# Patient Record
Sex: Female | Born: 1940 | Race: Black or African American | Hispanic: No
Health system: Southern US, Community
[De-identification: ages and names within clinical notes are randomized; demographics above are authoritative.]

## PROBLEM LIST (undated history)

## (undated) DIAGNOSIS — C9510 Chronic leukemia of unspecified cell type not having achieved remission: Secondary | ICD-10-CM

---

## 1999-12-12 ENCOUNTER — Encounter: Payer: Self-pay | Admitting: Internal Medicine

## 1999-12-12 ENCOUNTER — Ambulatory Visit (HOSPITAL_COMMUNITY): Admission: RE | Admit: 1999-12-12 | Discharge: 1999-12-12 | Payer: Self-pay | Admitting: Internal Medicine

## 2000-01-05 ENCOUNTER — Other Ambulatory Visit: Admission: RE | Admit: 2000-01-05 | Discharge: 2000-01-05 | Payer: Self-pay | Admitting: Oncology

## 2000-01-05 ENCOUNTER — Encounter: Admission: RE | Admit: 2000-01-05 | Discharge: 2000-01-05 | Payer: Self-pay | Admitting: Oncology

## 2000-01-05 ENCOUNTER — Encounter: Payer: Self-pay | Admitting: Oncology

## 2000-10-25 ENCOUNTER — Emergency Department (HOSPITAL_COMMUNITY): Admission: EM | Admit: 2000-10-25 | Discharge: 2000-10-26 | Payer: Self-pay

## 2000-10-30 ENCOUNTER — Encounter: Payer: Self-pay | Admitting: Internal Medicine

## 2000-10-30 ENCOUNTER — Encounter: Admission: RE | Admit: 2000-10-30 | Discharge: 2000-10-30 | Payer: Self-pay | Admitting: Internal Medicine

## 2000-11-30 ENCOUNTER — Encounter (HOSPITAL_BASED_OUTPATIENT_CLINIC_OR_DEPARTMENT_OTHER): Payer: Self-pay | Admitting: General Surgery

## 2000-12-03 ENCOUNTER — Ambulatory Visit (HOSPITAL_COMMUNITY): Admission: RE | Admit: 2000-12-03 | Discharge: 2000-12-03 | Payer: Self-pay | Admitting: General Surgery

## 2000-12-03 ENCOUNTER — Encounter (HOSPITAL_BASED_OUTPATIENT_CLINIC_OR_DEPARTMENT_OTHER): Payer: Self-pay | Admitting: General Surgery

## 2000-12-31 ENCOUNTER — Ambulatory Visit (HOSPITAL_COMMUNITY): Admission: RE | Admit: 2000-12-31 | Discharge: 2000-12-31 | Payer: Self-pay | Admitting: Oncology

## 2000-12-31 ENCOUNTER — Encounter: Payer: Self-pay | Admitting: Oncology

## 2001-01-22 ENCOUNTER — Ambulatory Visit: Admission: RE | Admit: 2001-01-22 | Discharge: 2001-01-22 | Payer: Self-pay | Admitting: Internal Medicine

## 2001-01-28 ENCOUNTER — Encounter: Payer: Self-pay | Admitting: Oncology

## 2001-01-28 ENCOUNTER — Ambulatory Visit (HOSPITAL_COMMUNITY): Admission: RE | Admit: 2001-01-28 | Discharge: 2001-01-28 | Payer: Self-pay | Admitting: Oncology

## 2001-05-08 ENCOUNTER — Ambulatory Visit (HOSPITAL_COMMUNITY): Admission: RE | Admit: 2001-05-08 | Discharge: 2001-05-08 | Payer: Self-pay | Admitting: General Surgery

## 2001-05-22 ENCOUNTER — Ambulatory Visit (HOSPITAL_COMMUNITY): Admission: RE | Admit: 2001-05-22 | Discharge: 2001-05-22 | Payer: Self-pay | Admitting: Internal Medicine

## 2002-03-21 ENCOUNTER — Ambulatory Visit (HOSPITAL_COMMUNITY): Admission: RE | Admit: 2002-03-21 | Discharge: 2002-03-21 | Payer: Self-pay | Admitting: Oncology

## 2002-03-21 ENCOUNTER — Encounter: Payer: Self-pay | Admitting: Oncology

## 2002-04-29 ENCOUNTER — Inpatient Hospital Stay (HOSPITAL_COMMUNITY): Admission: EM | Admit: 2002-04-29 | Discharge: 2002-05-02 | Payer: Self-pay | Admitting: Emergency Medicine

## 2002-04-29 ENCOUNTER — Encounter: Payer: Self-pay | Admitting: Emergency Medicine

## 2002-05-05 ENCOUNTER — Ambulatory Visit (HOSPITAL_COMMUNITY): Admission: RE | Admit: 2002-05-05 | Discharge: 2002-05-05 | Payer: Self-pay | Admitting: Oncology

## 2002-05-05 ENCOUNTER — Encounter: Payer: Self-pay | Admitting: Oncology

## 2003-03-03 ENCOUNTER — Ambulatory Visit: Admission: RE | Admit: 2003-03-03 | Discharge: 2003-03-03 | Payer: Self-pay | Admitting: Oncology

## 2003-05-01 ENCOUNTER — Ambulatory Visit (HOSPITAL_COMMUNITY): Admission: RE | Admit: 2003-05-01 | Discharge: 2003-05-01 | Payer: Self-pay | Admitting: Oncology

## 2003-12-12 ENCOUNTER — Ambulatory Visit: Payer: Self-pay | Admitting: Oncology

## 2003-12-15 ENCOUNTER — Inpatient Hospital Stay (HOSPITAL_COMMUNITY): Admission: EM | Admit: 2003-12-15 | Discharge: 2003-12-20 | Payer: Self-pay | Admitting: Oncology

## 2003-12-15 ENCOUNTER — Ambulatory Visit: Payer: Self-pay | Admitting: Oncology

## 2004-01-05 ENCOUNTER — Inpatient Hospital Stay (HOSPITAL_COMMUNITY): Admission: EM | Admit: 2004-01-05 | Discharge: 2004-01-11 | Payer: Self-pay | Admitting: Oncology

## 2004-01-05 ENCOUNTER — Ambulatory Visit: Payer: Self-pay | Admitting: Oncology

## 2004-02-12 ENCOUNTER — Ambulatory Visit: Payer: Self-pay | Admitting: Oncology

## 2004-03-21 ENCOUNTER — Ambulatory Visit (HOSPITAL_COMMUNITY): Admission: RE | Admit: 2004-03-21 | Discharge: 2004-03-21 | Payer: Self-pay | Admitting: Internal Medicine

## 2004-04-04 ENCOUNTER — Ambulatory Visit: Payer: Self-pay | Admitting: Oncology

## 2004-04-18 ENCOUNTER — Ambulatory Visit (HOSPITAL_COMMUNITY): Admission: RE | Admit: 2004-04-18 | Discharge: 2004-04-18 | Payer: Self-pay | Admitting: Oncology

## 2004-05-24 ENCOUNTER — Ambulatory Visit: Payer: Self-pay | Admitting: Oncology

## 2004-05-24 ENCOUNTER — Ambulatory Visit: Payer: Self-pay | Admitting: Pulmonary Disease

## 2004-05-27 ENCOUNTER — Ambulatory Visit: Payer: Self-pay | Admitting: Internal Medicine

## 2004-06-07 ENCOUNTER — Ambulatory Visit: Payer: Self-pay | Admitting: Pulmonary Disease

## 2004-06-29 ENCOUNTER — Ambulatory Visit: Payer: Self-pay | Admitting: Pulmonary Disease

## 2004-07-11 ENCOUNTER — Ambulatory Visit: Payer: Self-pay | Admitting: Oncology

## 2004-07-28 ENCOUNTER — Ambulatory Visit: Admission: RE | Admit: 2004-07-28 | Discharge: 2004-07-28 | Payer: Self-pay | Admitting: Oncology

## 2004-08-03 ENCOUNTER — Ambulatory Visit: Payer: Self-pay | Admitting: Pulmonary Disease

## 2004-09-05 ENCOUNTER — Ambulatory Visit: Payer: Self-pay | Admitting: Oncology

## 2004-10-21 ENCOUNTER — Ambulatory Visit: Payer: Self-pay | Admitting: Oncology

## 2004-11-21 ENCOUNTER — Inpatient Hospital Stay (HOSPITAL_COMMUNITY): Admission: EM | Admit: 2004-11-21 | Discharge: 2004-11-23 | Payer: Self-pay | Admitting: Oncology

## 2004-11-22 ENCOUNTER — Ambulatory Visit: Payer: Self-pay | Admitting: Oncology

## 2004-12-06 ENCOUNTER — Ambulatory Visit: Payer: Self-pay | Admitting: Oncology

## 2005-01-23 ENCOUNTER — Ambulatory Visit: Payer: Self-pay | Admitting: Oncology

## 2005-02-06 ENCOUNTER — Inpatient Hospital Stay (HOSPITAL_COMMUNITY): Admission: EM | Admit: 2005-02-06 | Discharge: 2005-02-09 | Payer: Self-pay | Admitting: Oncology

## 2005-02-09 ENCOUNTER — Ambulatory Visit: Payer: Self-pay | Admitting: Oncology

## 2005-03-16 ENCOUNTER — Ambulatory Visit: Payer: Self-pay | Admitting: Oncology

## 2005-04-13 ENCOUNTER — Ambulatory Visit (HOSPITAL_COMMUNITY): Admission: RE | Admit: 2005-04-13 | Discharge: 2005-04-13 | Payer: Self-pay | Admitting: Oncology

## 2005-05-03 ENCOUNTER — Ambulatory Visit: Payer: Self-pay | Admitting: Oncology

## 2005-06-07 ENCOUNTER — Encounter (INDEPENDENT_AMBULATORY_CARE_PROVIDER_SITE_OTHER): Payer: Self-pay | Admitting: *Deleted

## 2005-06-07 ENCOUNTER — Ambulatory Visit (HOSPITAL_COMMUNITY): Admission: RE | Admit: 2005-06-07 | Discharge: 2005-06-07 | Payer: Self-pay | Admitting: Internal Medicine

## 2005-06-14 LAB — CBC WITH DIFFERENTIAL/PLATELET
BASO%: 1.7 % (ref 0.0–2.0)
Eosinophils Absolute: 0.2 10*3/uL (ref 0.0–0.5)
LYMPH%: 71 % — ABNORMAL HIGH (ref 14.0–48.0)
MCHC: 31.3 g/dL — ABNORMAL LOW (ref 32.0–36.0)
MONO#: 0.8 10*3/uL (ref 0.1–0.9)
NEUT#: 2.6 10*3/uL (ref 1.5–6.5)
RBC: 4.76 10*6/uL (ref 3.70–5.32)
RDW: 14.4 % (ref 11.3–14.5)
WBC: 13.4 10*3/uL — ABNORMAL HIGH (ref 3.9–10.0)
lymph#: 9.5 10*3/uL — ABNORMAL HIGH (ref 0.9–3.3)

## 2005-06-14 LAB — MORPHOLOGY: PLT EST: ADEQUATE

## 2005-06-14 LAB — PROTIME-INR: Protime: 17.3 Seconds (ref 10.6–13.4)

## 2005-06-28 ENCOUNTER — Ambulatory Visit: Payer: Self-pay | Admitting: Oncology

## 2005-06-29 LAB — MORPHOLOGY: PLT EST: ADEQUATE

## 2005-06-29 LAB — CBC WITH DIFFERENTIAL/PLATELET
Basophils Absolute: 0 10*3/uL (ref 0.0–0.1)
Eosinophils Absolute: 0.2 10*3/uL (ref 0.0–0.5)
HCT: 33 % — ABNORMAL LOW (ref 34.8–46.6)
LYMPH%: 73 % — ABNORMAL HIGH (ref 14.0–48.0)
MCV: 75.4 fL — ABNORMAL LOW (ref 81.0–101.0)
MONO#: 0.6 10*3/uL (ref 0.1–0.9)
MONO%: 6.4 % (ref 0.0–13.0)
NEUT#: 1.7 10*3/uL (ref 1.5–6.5)
NEUT%: 18.1 % — ABNORMAL LOW (ref 39.6–76.8)
Platelets: 219 10*3/uL (ref 145–400)
WBC: 9.2 10*3/uL (ref 3.9–10.0)

## 2005-06-29 LAB — PROTIME-INR
INR: 1.8 — ABNORMAL LOW (ref 2.00–3.50)
Protime: 16.4 Seconds — ABNORMAL HIGH (ref 10.6–13.4)

## 2005-06-29 LAB — COMPREHENSIVE METABOLIC PANEL
ALT: 12 U/L (ref 0–40)
Alkaline Phosphatase: 102 U/L (ref 39–117)
CO2: 24 mEq/L (ref 19–32)
Creatinine, Ser: 0.9 mg/dL (ref 0.4–1.2)
Total Bilirubin: 0.4 mg/dL (ref 0.3–1.2)

## 2005-07-12 LAB — CBC WITH DIFFERENTIAL/PLATELET
Basophils Absolute: 0.1 10*3/uL (ref 0.0–0.1)
Eosinophils Absolute: 0.2 10*3/uL (ref 0.0–0.5)
HGB: 10.5 g/dL — ABNORMAL LOW (ref 11.6–15.9)
MCV: 76.1 fL — ABNORMAL LOW (ref 81.0–101.0)
MONO#: 1 10*3/uL — ABNORMAL HIGH (ref 0.1–0.9)
NEUT#: 1.8 10*3/uL (ref 1.5–6.5)
Platelets: 249 10*3/uL (ref 145–400)
RBC: 4.25 10*6/uL (ref 3.70–5.32)
RDW: 15.4 % — ABNORMAL HIGH (ref 11.3–14.5)
WBC: 15.3 10*3/uL — ABNORMAL HIGH (ref 3.9–10.0)

## 2005-07-12 LAB — MORPHOLOGY: PLT EST: ADEQUATE

## 2005-08-08 LAB — CBC WITH DIFFERENTIAL/PLATELET
BASO%: 0.5 % (ref 0.0–2.0)
HCT: 32.3 % — ABNORMAL LOW (ref 34.8–46.6)
MCHC: 31.8 g/dL — ABNORMAL LOW (ref 32.0–36.0)
MONO#: 2.2 10*3/uL — ABNORMAL HIGH (ref 0.1–0.9)
NEUT%: 6.7 % — ABNORMAL LOW (ref 39.6–76.8)
WBC: 31.2 10*3/uL — ABNORMAL HIGH (ref 3.9–10.0)
lymph#: 26.4 10*3/uL — ABNORMAL HIGH (ref 0.9–3.3)

## 2005-08-08 LAB — MORPHOLOGY: PLT EST: ADEQUATE

## 2005-08-08 LAB — COMPREHENSIVE METABOLIC PANEL
BUN: 10 mg/dL (ref 6–23)
CO2: 25 mEq/L (ref 19–32)
Creatinine, Ser: 0.71 mg/dL (ref 0.40–1.20)
Glucose, Bld: 95 mg/dL (ref 70–99)
Total Bilirubin: 0.3 mg/dL (ref 0.3–1.2)

## 2005-08-08 LAB — LACTATE DEHYDROGENASE: LDH: 592 U/L — ABNORMAL HIGH (ref 94–250)

## 2005-08-14 ENCOUNTER — Ambulatory Visit: Payer: Self-pay | Admitting: Oncology

## 2005-08-16 LAB — CBC WITH DIFFERENTIAL/PLATELET
BASO%: 1.1 % (ref 0.0–2.0)
Basophils Absolute: 0.4 10*3/uL — ABNORMAL HIGH (ref 0.0–0.1)
HCT: 34 % — ABNORMAL LOW (ref 34.8–46.6)
HGB: 11 g/dL — ABNORMAL LOW (ref 11.6–15.9)
MONO#: 2 10*3/uL — ABNORMAL HIGH (ref 0.1–0.9)
NEUT%: 6.9 % — ABNORMAL LOW (ref 39.6–76.8)
WBC: 32.2 10*3/uL — ABNORMAL HIGH (ref 3.9–10.0)
lymph#: 27.1 10*3/uL — ABNORMAL HIGH (ref 0.9–3.3)

## 2005-09-05 LAB — CBC WITH DIFFERENTIAL/PLATELET
BASO%: 1.4 % (ref 0.0–2.0)
Eosinophils Absolute: 0.6 10*3/uL — ABNORMAL HIGH (ref 0.0–0.5)
LYMPH%: 82.9 % — ABNORMAL HIGH (ref 14.0–48.0)
MONO#: 2.5 10*3/uL — ABNORMAL HIGH (ref 0.1–0.9)
NEUT#: 3 10*3/uL (ref 1.5–6.5)
Platelets: 227 10*3/uL (ref 145–400)
RBC: 4.21 10*6/uL (ref 3.70–5.32)
RDW: 14.6 % — ABNORMAL HIGH (ref 11.3–14.5)
WBC: 38.7 10*3/uL — ABNORMAL HIGH (ref 3.9–10.0)
lymph#: 32.1 10*3/uL — ABNORMAL HIGH (ref 0.9–3.3)

## 2005-09-05 LAB — COMPREHENSIVE METABOLIC PANEL
AST: 17 U/L (ref 0–37)
BUN: 10 mg/dL (ref 6–23)
Calcium: 9.2 mg/dL (ref 8.4–10.5)
Chloride: 107 mEq/L (ref 96–112)
Creatinine, Ser: 0.74 mg/dL (ref 0.40–1.20)
Glucose, Bld: 144 mg/dL — ABNORMAL HIGH (ref 70–99)

## 2005-09-05 LAB — PROTIME-INR
INR: 1.2 — ABNORMAL LOW (ref 2.00–3.50)
Protime: 13.8 Seconds — ABNORMAL HIGH (ref 10.6–13.4)

## 2005-09-05 LAB — MORPHOLOGY

## 2005-09-05 LAB — URIC ACID: Uric Acid, Serum: 6.1 mg/dL (ref 2.4–7.0)

## 2005-09-11 ENCOUNTER — Ambulatory Visit (HOSPITAL_COMMUNITY): Admission: RE | Admit: 2005-09-11 | Discharge: 2005-09-11 | Payer: Self-pay | Admitting: Oncology

## 2005-09-13 LAB — CBC WITH DIFFERENTIAL/PLATELET
Eosinophils Absolute: 0.3 10*3/uL (ref 0.0–0.5)
HGB: 9.9 g/dL — ABNORMAL LOW (ref 11.6–15.9)
MONO#: 1.4 10*3/uL — ABNORMAL HIGH (ref 0.1–0.9)
NEUT#: 3.6 10*3/uL (ref 1.5–6.5)
RBC: 4.17 10*6/uL (ref 3.70–5.32)
RDW: 13.2 % (ref 11.3–14.5)
WBC: 35 10*3/uL — ABNORMAL HIGH (ref 3.9–10.0)
lymph#: 29.6 10*3/uL — ABNORMAL HIGH (ref 0.9–3.3)

## 2005-09-13 LAB — PROTIME-INR
INR: 1.7 (ref 2.00–3.50)
Protime: 16 Seconds (ref 10.6–13.4)

## 2005-09-13 LAB — MORPHOLOGY: PLT EST: ADEQUATE

## 2005-10-02 ENCOUNTER — Ambulatory Visit: Payer: Self-pay | Admitting: Oncology

## 2005-10-04 LAB — MANUAL DIFFERENTIAL
LYMPH: 93 % — ABNORMAL HIGH (ref 14–49)
MONO: 2 % (ref 0–14)
SEG: 5 % — ABNORMAL LOW (ref 38–77)

## 2005-10-04 LAB — CBC WITH DIFFERENTIAL/PLATELET
MCH: 23.8 pg — ABNORMAL LOW (ref 26.0–34.0)
MCHC: 30.1 g/dL — ABNORMAL LOW (ref 32.0–36.0)
MCV: 79.2 fL — ABNORMAL LOW (ref 81.0–101.0)
RBC: 3.96 10*6/uL (ref 3.70–5.32)
RDW: 13.9 % (ref 11.3–14.5)

## 2005-10-13 LAB — CBC WITH DIFFERENTIAL/PLATELET
HCT: 33.2 % — ABNORMAL LOW (ref 34.8–46.6)
MCH: 23.9 pg — ABNORMAL LOW (ref 26.0–34.0)
MCHC: 29.9 g/dL — ABNORMAL LOW (ref 32.0–36.0)
MCV: 80 fL — ABNORMAL LOW (ref 81.0–101.0)
RDW: 14.1 % (ref 11.3–14.5)

## 2005-10-13 LAB — MANUAL DIFFERENTIAL
ALC: 43.3 10*3/uL — ABNORMAL HIGH (ref 0.9–3.3)
ANC (CHCC manual diff): 1.4 10*3/uL — ABNORMAL LOW (ref 1.5–6.5)
SEG: 3 % — ABNORMAL LOW (ref 38–77)

## 2005-10-13 LAB — PROTIME-INR: Protime: 19.2 Seconds — ABNORMAL HIGH (ref 10.6–13.4)

## 2005-10-17 LAB — COMPREHENSIVE METABOLIC PANEL
Alkaline Phosphatase: 99 U/L (ref 39–117)
Creatinine, Ser: 0.85 mg/dL (ref 0.40–1.20)
Glucose, Bld: 155 mg/dL — ABNORMAL HIGH (ref 70–99)
Sodium: 142 mEq/L (ref 135–145)
Total Bilirubin: 0.4 mg/dL (ref 0.3–1.2)
Total Protein: 7.6 g/dL (ref 6.0–8.3)

## 2005-10-17 LAB — PROTIME-INR: Protime: 21.6 Seconds — ABNORMAL HIGH (ref 10.6–13.4)

## 2005-10-17 LAB — CBC WITH DIFFERENTIAL/PLATELET
BASO%: 0.2 % (ref 0.0–2.0)
Basophils Absolute: 0.1 10*3/uL (ref 0.0–0.1)
HCT: 33.8 % — ABNORMAL LOW (ref 34.8–46.6)
HGB: 10.7 g/dL — ABNORMAL LOW (ref 11.6–15.9)
MONO#: 4.1 10*3/uL — ABNORMAL HIGH (ref 0.1–0.9)
NEUT#: 5.1 10*3/uL (ref 1.5–6.5)
NEUT%: 9.4 % — ABNORMAL LOW (ref 39.6–76.8)
WBC: 54.4 10*3/uL (ref 3.9–10.0)
lymph#: 44.7 10*3/uL — ABNORMAL HIGH (ref 0.9–3.3)

## 2005-10-17 LAB — MORPHOLOGY: PLT EST: ADEQUATE

## 2005-11-10 ENCOUNTER — Ambulatory Visit (HOSPITAL_COMMUNITY): Admission: RE | Admit: 2005-11-10 | Discharge: 2005-11-10 | Payer: Self-pay | Admitting: Oncology

## 2005-11-10 LAB — MORPHOLOGY: PLT EST: ADEQUATE

## 2005-11-10 LAB — CBC WITH DIFFERENTIAL/PLATELET
Basophils Absolute: 0.6 10*3/uL — ABNORMAL HIGH (ref 0.0–0.1)
Eosinophils Absolute: 0.6 10*3/uL — ABNORMAL HIGH (ref 0.0–0.5)
HGB: 9.3 g/dL — ABNORMAL LOW (ref 11.6–15.9)
MCV: 80 fL — ABNORMAL LOW (ref 81.0–101.0)
MONO#: 1.8 10*3/uL — ABNORMAL HIGH (ref 0.1–0.9)
MONO%: 3.2 % (ref 0.0–13.0)
NEUT#: 3 10*3/uL (ref 1.5–6.5)
RDW: 13.9 % (ref 11.3–14.5)
lymph#: 51 10*3/uL — ABNORMAL HIGH (ref 0.9–3.3)

## 2005-11-10 LAB — PROTIME-INR
INR: 1.9 — ABNORMAL LOW (ref 2.00–3.50)
Protime: 22.8 Seconds — ABNORMAL HIGH (ref 10.6–13.4)

## 2005-11-16 ENCOUNTER — Ambulatory Visit: Payer: Self-pay | Admitting: Oncology

## 2005-11-16 ENCOUNTER — Inpatient Hospital Stay (HOSPITAL_COMMUNITY): Admission: EM | Admit: 2005-11-16 | Discharge: 2005-11-23 | Payer: Self-pay | Admitting: Oncology

## 2005-11-16 LAB — COMPREHENSIVE METABOLIC PANEL
AST: 20 U/L (ref 0–37)
Albumin: 3.7 g/dL (ref 3.5–5.2)
Alkaline Phosphatase: 107 U/L (ref 39–117)
BUN: 14 mg/dL (ref 6–23)
Potassium: 3.5 mEq/L (ref 3.5–5.3)
Sodium: 141 mEq/L (ref 135–145)
Total Protein: 7.8 g/dL (ref 6.0–8.3)

## 2005-11-16 LAB — CBC WITH DIFFERENTIAL/PLATELET
Basophils Absolute: 0.3 10*3/uL — ABNORMAL HIGH (ref 0.0–0.1)
EOS%: 0 % (ref 0.0–7.0)
Eosinophils Absolute: 0 10*3/uL (ref 0.0–0.5)
HCT: 35.4 % (ref 34.8–46.6)
HGB: 10.9 g/dL — ABNORMAL LOW (ref 11.6–15.9)
MCH: 25 pg — ABNORMAL LOW (ref 26.0–34.0)
MCV: 80.7 fL — ABNORMAL LOW (ref 81.0–101.0)
MONO%: 1.9 % (ref 0.0–13.0)
NEUT%: 5.5 % — ABNORMAL LOW (ref 39.6–76.8)
Platelets: 199 10*3/uL (ref 145–400)

## 2005-11-17 ENCOUNTER — Ambulatory Visit: Payer: Self-pay | Admitting: Oncology

## 2006-01-05 ENCOUNTER — Ambulatory Visit: Payer: Self-pay | Admitting: Oncology

## 2006-01-09 LAB — CBC WITH DIFFERENTIAL/PLATELET
Eosinophils Absolute: 0.1 10*3/uL (ref 0.0–0.5)
LYMPH%: 92.4 % — ABNORMAL HIGH (ref 14.0–48.0)
MONO#: 2.6 10*3/uL — ABNORMAL HIGH (ref 0.1–0.9)
NEUT#: 3.7 10*3/uL (ref 1.5–6.5)
Platelets: 251 10*3/uL (ref 145–400)
RBC: 3.84 10*6/uL (ref 3.70–5.32)
WBC: 88.5 10*3/uL (ref 3.9–10.0)
lymph#: 81.7 10*3/uL — ABNORMAL HIGH (ref 0.9–3.3)

## 2006-01-09 LAB — PROTIME-INR
INR: 2.3 (ref 2.00–3.50)
Protime: 27.6 Seconds — ABNORMAL HIGH (ref 10.6–13.4)

## 2006-01-09 LAB — MORPHOLOGY

## 2006-02-06 LAB — CBC WITH DIFFERENTIAL/PLATELET
Basophils Absolute: 2.6 10*3/uL — ABNORMAL HIGH (ref 0.0–0.1)
EOS%: 0.2 % (ref 0.0–7.0)
MCH: 24 pg — ABNORMAL LOW (ref 26.0–34.0)
MCV: 81.7 fL (ref 81.0–101.0)
MONO%: 4.6 % (ref 0.0–13.0)
RBC: 3.96 10*6/uL (ref 3.70–5.32)
RDW: 13.4 % (ref 11.3–14.5)

## 2006-02-06 LAB — PROTIME-INR: Protime: 18 Seconds — ABNORMAL HIGH (ref 10.6–13.4)

## 2006-02-06 LAB — MORPHOLOGY

## 2006-03-01 ENCOUNTER — Ambulatory Visit: Payer: Self-pay | Admitting: Oncology

## 2006-03-06 LAB — COMPREHENSIVE METABOLIC PANEL
AST: 18 U/L (ref 0–37)
BUN: 13 mg/dL (ref 6–23)
CO2: 23 mEq/L (ref 19–32)
Calcium: 9.4 mg/dL (ref 8.4–10.5)
Chloride: 109 mEq/L (ref 96–112)
Creatinine, Ser: 0.71 mg/dL (ref 0.40–1.20)

## 2006-03-06 LAB — CBC & DIFF AND RETIC
Eosinophils Absolute: 0 10*3/uL (ref 0.0–0.5)
MCV: 82.7 fL (ref 81.0–101.0)
MONO%: 2 % (ref 0.0–13.0)
NEUT#: 3.4 10*3/uL (ref 1.5–6.5)
RBC: 3.91 10*6/uL (ref 3.70–5.32)
RDW: 15 % — ABNORMAL HIGH (ref 11.3–14.5)
RETIC #: 89.9 10*3/uL (ref 19.7–115.1)
Retic %: 2.3 % (ref 0.4–2.3)
WBC: 140.5 10*3/uL (ref 3.9–10.0)
lymph#: 133.3 10*3/uL — ABNORMAL HIGH (ref 0.9–3.3)

## 2006-03-06 LAB — MORPHOLOGY: PLT EST: ADEQUATE

## 2006-03-06 LAB — PROTIME-INR: INR: 1.9 — ABNORMAL LOW (ref 2.00–3.50)

## 2006-03-06 LAB — LACTATE DEHYDROGENASE: LDH: 642 U/L — ABNORMAL HIGH (ref 94–250)

## 2006-04-03 LAB — COMPREHENSIVE METABOLIC PANEL
AST: 18 U/L (ref 0–37)
Albumin: 4 g/dL (ref 3.5–5.2)
Alkaline Phosphatase: 117 U/L (ref 39–117)
BUN: 12 mg/dL (ref 6–23)
Potassium: 3.7 mEq/L (ref 3.5–5.3)
Sodium: 143 mEq/L (ref 135–145)
Total Bilirubin: 0.4 mg/dL (ref 0.3–1.2)

## 2006-04-03 LAB — CBC WITH DIFFERENTIAL/PLATELET
MCHC: 29.3 g/dL — ABNORMAL LOW (ref 32.0–36.0)
RBC: 3.48 10*6/uL — ABNORMAL LOW (ref 3.70–5.32)
RDW: 16.9 % — ABNORMAL HIGH (ref 11.3–14.5)

## 2006-04-03 LAB — PROTIME-INR
INR: 1.7 — ABNORMAL LOW (ref 2.00–3.50)
Protime: 20.4 Seconds — ABNORMAL HIGH (ref 10.6–13.4)

## 2006-04-03 LAB — MANUAL DIFFERENTIAL
EOS: 1 % (ref 0–7)
MONO: 1 % (ref 0–14)
Metamyelocytes: 0 % (ref 0–0)
Myelocytes: 0 % (ref 0–0)
Other Cell: 0 % (ref 0–0)

## 2006-04-03 LAB — LACTATE DEHYDROGENASE: LDH: 945 U/L — ABNORMAL HIGH (ref 94–250)

## 2006-04-09 ENCOUNTER — Encounter (HOSPITAL_COMMUNITY): Admission: RE | Admit: 2006-04-09 | Discharge: 2006-04-09 | Payer: Self-pay | Admitting: Oncology

## 2006-04-09 LAB — CBC WITH DIFFERENTIAL/PLATELET
BASO%: 2.3 % — ABNORMAL HIGH (ref 0.0–2.0)
EOS%: 0.2 % (ref 0.0–7.0)
HCT: 30.2 % — ABNORMAL LOW (ref 34.8–46.6)
MCH: 24.4 pg — ABNORMAL LOW (ref 26.0–34.0)
MCHC: 28 g/dL — ABNORMAL LOW (ref 32.0–36.0)
NEUT%: 6.8 % — ABNORMAL LOW (ref 39.6–76.8)
RBC: 3.46 10*6/uL — ABNORMAL LOW (ref 3.70–5.32)
lymph#: 223.2 10*3/uL — ABNORMAL HIGH (ref 0.9–3.3)

## 2006-04-09 LAB — TECHNOLOGIST REVIEW

## 2006-04-12 ENCOUNTER — Ambulatory Visit: Payer: Self-pay | Admitting: Oncology

## 2006-04-16 LAB — CBC WITH DIFFERENTIAL/PLATELET
BASO%: 0.7 % (ref 0.0–2.0)
LYMPH%: 95.3 % — ABNORMAL HIGH (ref 14.0–48.0)
MCHC: 30.6 g/dL — ABNORMAL LOW (ref 32.0–36.0)
MCV: 82.2 fL (ref 81.0–101.0)
MONO#: 2.6 10*3/uL — ABNORMAL HIGH (ref 0.1–0.9)
MONO%: 1.7 % (ref 0.0–13.0)
Platelets: 89 10*3/uL — ABNORMAL LOW (ref 145–400)
RBC: 4.11 10*6/uL (ref 3.70–5.32)
RDW: 14.9 % — ABNORMAL HIGH (ref 11.3–14.5)
WBC: 154 10*3/uL (ref 3.9–10.0)

## 2006-04-16 LAB — COMPREHENSIVE METABOLIC PANEL
Albumin: 4.3 g/dL (ref 3.5–5.2)
BUN: 12 mg/dL (ref 6–23)
Calcium: 9.6 mg/dL (ref 8.4–10.5)
Chloride: 105 mEq/L (ref 96–112)
Glucose, Bld: 120 mg/dL — ABNORMAL HIGH (ref 70–99)
Potassium: 3.8 mEq/L (ref 3.5–5.3)

## 2006-04-16 LAB — TECHNOLOGIST REVIEW

## 2006-04-16 LAB — URIC ACID: Uric Acid, Serum: 4.4 mg/dL (ref 2.4–7.0)

## 2006-04-23 ENCOUNTER — Inpatient Hospital Stay (HOSPITAL_COMMUNITY): Admission: EM | Admit: 2006-04-23 | Discharge: 2006-04-27 | Payer: Self-pay | Admitting: Oncology

## 2006-04-23 LAB — CBC WITH DIFFERENTIAL/PLATELET
Basophils Absolute: 0.3 10*3/uL — ABNORMAL HIGH (ref 0.0–0.1)
EOS%: 0.3 % (ref 0.0–7.0)
Eosinophils Absolute: 0.1 10*3/uL (ref 0.0–0.5)
HCT: 31.4 % — ABNORMAL LOW (ref 34.8–46.6)
HGB: 9.4 g/dL — ABNORMAL LOW (ref 11.6–15.9)
MCH: 25.1 pg — ABNORMAL LOW (ref 26.0–34.0)
MCV: 83.7 fL (ref 81.0–101.0)
MONO%: 11.8 % (ref 0.0–13.0)
NEUT#: 0.7 10*3/uL — ABNORMAL LOW (ref 1.5–6.5)
NEUT%: 2.2 % — ABNORMAL LOW (ref 39.6–76.8)
Platelets: 122 10*3/uL — ABNORMAL LOW (ref 145–400)

## 2006-04-23 LAB — COMPREHENSIVE METABOLIC PANEL
ALT: <8 U/L (ref 0–35)
Alkaline Phosphatase: 97 U/L (ref 39–117)
Creatinine, Ser: 0.78 mg/dL (ref 0.40–1.20)
Glucose, Bld: 191 mg/dL — ABNORMAL HIGH (ref 70–99)
Sodium: 138 mEq/L (ref 135–145)
Total Bilirubin: 0.4 mg/dL (ref 0.3–1.2)
Total Protein: 6.8 g/dL (ref 6.0–8.3)

## 2006-04-23 LAB — URIC ACID: Uric Acid, Serum: 5.7 mg/dL (ref 2.4–7.0)

## 2006-04-24 ENCOUNTER — Ambulatory Visit: Payer: Self-pay | Admitting: Oncology

## 2006-04-30 LAB — COMPREHENSIVE METABOLIC PANEL
Alkaline Phosphatase: 83 U/L (ref 39–117)
CO2: 22 mEq/L (ref 19–32)
Creatinine, Ser: 0.67 mg/dL (ref 0.40–1.20)
Glucose, Bld: 136 mg/dL — ABNORMAL HIGH (ref 70–99)
Sodium: 142 mEq/L (ref 135–145)
Total Bilirubin: 0.3 mg/dL (ref 0.3–1.2)

## 2006-04-30 LAB — CBC WITH DIFFERENTIAL/PLATELET
HGB: 9.3 g/dL — ABNORMAL LOW (ref 11.6–15.9)
MCH: 26.3 pg (ref 26.0–34.0)
MCV: 81.8 fL (ref 81.0–101.0)
Platelets: 184 10*3/uL (ref 145–400)
RBC: 3.52 10*6/uL — ABNORMAL LOW (ref 3.70–5.32)
RDW: 17.2 % — ABNORMAL HIGH (ref 11.3–14.5)
WBC: 38.5 10*3/uL — ABNORMAL HIGH (ref 3.9–10.0)

## 2006-04-30 LAB — MANUAL DIFFERENTIAL
Band Neutrophils: 1 % (ref 0–10)
MONO: 0 % (ref 0–14)
SEG: 2 % — ABNORMAL LOW (ref 38–77)
Variant Lymph: 16 % — ABNORMAL HIGH (ref 0–0)

## 2006-05-01 LAB — COMPREHENSIVE METABOLIC PANEL
ALT: 8 U/L (ref 0–35)
BUN: 10 mg/dL (ref 6–23)
CO2: 21 mEq/L (ref 19–32)
Calcium: 9.4 mg/dL (ref 8.4–10.5)
Chloride: 106 mEq/L (ref 96–112)
Creatinine, Ser: 0.82 mg/dL (ref 0.40–1.20)
Total Bilirubin: 0.3 mg/dL (ref 0.3–1.2)

## 2006-05-01 LAB — CBC WITH DIFFERENTIAL/PLATELET
MCH: 25.1 pg — ABNORMAL LOW (ref 26.0–34.0)
MCHC: 30 g/dL — ABNORMAL LOW (ref 32.0–36.0)
MCV: 83.7 fL (ref 81.0–101.0)
Platelets: 171 10*3/uL (ref 145–400)
RBC: 3.58 10*6/uL — ABNORMAL LOW (ref 3.70–5.32)
RDW: 14.7 % — ABNORMAL HIGH (ref 11.3–14.5)

## 2006-05-01 LAB — LACTATE DEHYDROGENASE: LDH: 589 U/L — ABNORMAL HIGH (ref 94–250)

## 2006-05-01 LAB — MANUAL DIFFERENTIAL
Band Neutrophils: 1 % (ref 0–10)
Basophil: 0 % (ref 0–2)
EOS: 1 % (ref 0–7)
LYMPH: 76 % — ABNORMAL HIGH (ref 14–49)
Other Cell: 0 % (ref 0–0)
PLT EST: ADEQUATE
SEG: 6 % — ABNORMAL LOW (ref 38–77)
nRBC: 0 % (ref 0–0)

## 2006-05-07 LAB — COMPREHENSIVE METABOLIC PANEL
ALT: <8 U/L (ref 0–35)
AST: 16 U/L (ref 0–37)
Albumin: 4.1 g/dL (ref 3.5–5.2)
Alkaline Phosphatase: 85 U/L (ref 39–117)
BUN: 10 mg/dL (ref 6–23)
Calcium: 9.5 mg/dL (ref 8.4–10.5)
Chloride: 107 mEq/L (ref 96–112)
Potassium: 3.7 mEq/L (ref 3.5–5.3)
Sodium: 140 mEq/L (ref 135–145)
Total Protein: 7.3 g/dL (ref 6.0–8.3)

## 2006-05-07 LAB — CBC WITH DIFFERENTIAL/PLATELET
BASO%: 0.2 % (ref 0.0–2.0)
HCT: 30.5 % — ABNORMAL LOW (ref 34.8–46.6)
LYMPH%: 87.1 % — ABNORMAL HIGH (ref 14.0–48.0)
MCH: 26.5 pg (ref 26.0–34.0)
MCHC: 32.8 g/dL (ref 32.0–36.0)
MCV: 81 fL (ref 81.0–101.0)
MONO#: 1 10*3/uL — ABNORMAL HIGH (ref 0.1–0.9)
MONO%: 8.3 % (ref 0.0–13.0)
NEUT%: 4 % — ABNORMAL LOW (ref 39.6–76.8)
Platelets: 179 10*3/uL (ref 145–400)
RBC: 3.77 10*6/uL (ref 3.70–5.32)
WBC: 12.5 10*3/uL — ABNORMAL HIGH (ref 3.9–10.0)

## 2006-05-11 LAB — CBC WITH DIFFERENTIAL/PLATELET
BASO%: 0.5 % (ref 0.0–2.0)
Basophils Absolute: 0 10e3/uL (ref 0.0–0.1)
EOS%: 1.1 % (ref 0.0–7.0)
Eosinophils Absolute: 0 10e3/uL (ref 0.0–0.5)
HCT: 27.8 % — ABNORMAL LOW (ref 34.8–46.6)
HGB: 9.3 g/dL — ABNORMAL LOW (ref 11.6–15.9)
LYMPH%: 81.9 % — ABNORMAL HIGH (ref 14.0–48.0)
MCH: 27 pg (ref 26.0–34.0)
MCHC: 33.4 g/dL (ref 32.0–36.0)
MCV: 80.8 fL — ABNORMAL LOW (ref 81.0–101.0)
MONO#: 0.2 10e3/uL (ref 0.1–0.9)
MONO%: 5.3 % (ref 0.0–13.0)
NEUT#: 0.5 10e3/uL — ABNORMAL LOW (ref 1.5–6.5)
NEUT%: 11.2 % — ABNORMAL LOW (ref 39.6–76.8)
Platelets: 180 10e3/uL (ref 145–400)
RBC: 3.44 10e6/uL — ABNORMAL LOW (ref 3.70–5.32)
RDW: 16.1 % — ABNORMAL HIGH (ref 11.3–14.5)
WBC: 4.2 10e3/uL (ref 3.9–10.0)
lymph#: 3.4 10e3/uL — ABNORMAL HIGH (ref 0.9–3.3)

## 2006-05-11 LAB — COMPREHENSIVE METABOLIC PANEL WITH GFR
ALT: 8 U/L (ref 0–35)
AST: 18 U/L (ref 0–37)
Albumin: 4 g/dL (ref 3.5–5.2)
Alkaline Phosphatase: 87 U/L (ref 39–117)
BUN: 10 mg/dL (ref 6–23)
CO2: 20 meq/L (ref 19–32)
Calcium: 9.8 mg/dL (ref 8.4–10.5)
Chloride: 106 meq/L (ref 96–112)
Creatinine, Ser: 0.74 mg/dL (ref 0.40–1.20)
Glucose, Bld: 141 mg/dL — ABNORMAL HIGH (ref 70–99)
Potassium: 3.5 meq/L (ref 3.5–5.3)
Sodium: 138 meq/L (ref 135–145)
Total Bilirubin: 0.4 mg/dL (ref 0.3–1.2)
Total Protein: 6.8 g/dL (ref 6.0–8.3)

## 2006-05-11 LAB — LACTATE DEHYDROGENASE: LDH: 463 U/L — ABNORMAL HIGH (ref 94–250)

## 2006-05-21 LAB — CBC WITH DIFFERENTIAL/PLATELET
BASO%: 1.1 % (ref 0.0–2.0)
Eosinophils Absolute: 0 10*3/uL (ref 0.0–0.5)
HCT: 29.3 % — ABNORMAL LOW (ref 34.8–46.6)
HGB: 9 g/dL — ABNORMAL LOW (ref 11.6–15.9)
MCHC: 30.6 g/dL — ABNORMAL LOW (ref 32.0–36.0)
MONO#: 0.3 10*3/uL (ref 0.1–0.9)
NEUT#: 0.6 10*3/uL — ABNORMAL LOW (ref 1.5–6.5)
NEUT%: 35.1 % — ABNORMAL LOW (ref 39.6–76.8)
WBC: 1.8 10*3/uL — ABNORMAL LOW (ref 3.9–10.0)
lymph#: 0.8 10*3/uL — ABNORMAL LOW (ref 0.9–3.3)

## 2006-05-29 ENCOUNTER — Ambulatory Visit: Payer: Self-pay | Admitting: Oncology

## 2006-05-29 LAB — MANUAL DIFFERENTIAL
Basophil: 1 % (ref 0–2)
EOS: 1 % (ref 0–7)
LYMPH: 65 % — ABNORMAL HIGH (ref 14–49)
MONO: 6 % (ref 0–14)
Metamyelocytes: 0 % (ref 0–0)
PLT EST: ADEQUATE

## 2006-05-29 LAB — COMPREHENSIVE METABOLIC PANEL
ALT: 8 U/L (ref 0–35)
Alkaline Phosphatase: 90 U/L (ref 39–117)
Glucose, Bld: 140 mg/dL — ABNORMAL HIGH (ref 70–99)
Sodium: 141 mEq/L (ref 135–145)
Total Bilirubin: 0.3 mg/dL (ref 0.3–1.2)
Total Protein: 6.7 g/dL (ref 6.0–8.3)

## 2006-05-29 LAB — CBC WITH DIFFERENTIAL/PLATELET
HCT: 33.3 % — ABNORMAL LOW (ref 34.8–46.6)
MCHC: 30.4 g/dL — ABNORMAL LOW (ref 32.0–36.0)
Platelets: 168 10*3/uL (ref 145–400)

## 2006-05-29 LAB — PROTIME-INR: Protime: 32.3 Seconds — ABNORMAL HIGH (ref 10.6–13.4)

## 2006-06-01 ENCOUNTER — Ambulatory Visit (HOSPITAL_COMMUNITY): Admission: RE | Admit: 2006-06-01 | Discharge: 2006-06-01 | Payer: Self-pay | Admitting: Oncology

## 2006-06-05 LAB — CBC WITH DIFFERENTIAL/PLATELET
BASO%: 1.6 % (ref 0.0–2.0)
Basophils Absolute: 0.1 10*3/uL (ref 0.0–0.1)
HCT: 32.9 % — ABNORMAL LOW (ref 34.8–46.6)
HGB: 10.2 g/dL — ABNORMAL LOW (ref 11.6–15.9)
LYMPH%: 76.1 % — ABNORMAL HIGH (ref 14.0–48.0)
MCHC: 30.9 g/dL — ABNORMAL LOW (ref 32.0–36.0)
MONO#: 0.5 10*3/uL (ref 0.1–0.9)
NEUT%: 12.5 % — ABNORMAL LOW (ref 39.6–76.8)
Platelets: 172 10*3/uL (ref 145–400)
WBC: 7.2 10*3/uL (ref 3.9–10.0)

## 2006-06-05 LAB — MORPHOLOGY: PLT EST: ADEQUATE

## 2006-06-12 LAB — MANUAL DIFFERENTIAL
ALC: 9.9 10*3/uL — ABNORMAL HIGH (ref 0.9–3.3)
ANC (CHCC manual diff): 1.3 10*3/uL — ABNORMAL LOW (ref 1.5–6.5)
Basophil: 0 % (ref 0–2)
EOS: 1 % (ref 0–7)
MONO: 1 % (ref 0–14)
Myelocytes: 0 % (ref 0–0)
Other Cell: 0 % (ref 0–0)
PLT EST: ADEQUATE
PROMYELO: 0 % (ref 0–0)
SEG: 11 % — ABNORMAL LOW (ref 38–77)

## 2006-06-12 LAB — CBC WITH DIFFERENTIAL/PLATELET
HCT: 33.6 % — ABNORMAL LOW (ref 34.8–46.6)
HGB: 10.2 g/dL — ABNORMAL LOW (ref 11.6–15.9)

## 2006-06-19 LAB — CBC WITH DIFFERENTIAL/PLATELET
HGB: 10.7 g/dL — ABNORMAL LOW (ref 11.6–15.9)
MCH: 25.1 pg — ABNORMAL LOW (ref 26.0–34.0)
MCHC: 30.9 g/dL — ABNORMAL LOW (ref 32.0–36.0)
RDW: 12 % (ref 11.3–14.5)

## 2006-06-19 LAB — MANUAL DIFFERENTIAL
ALC: 11.7 10*3/uL — ABNORMAL HIGH (ref 0.9–3.3)
ANC (CHCC manual diff): 0.8 10*3/uL — ABNORMAL LOW (ref 1.5–6.5)
Basophil: 0 % (ref 0–2)
Blasts: 0 % (ref 0–0)
Metamyelocytes: 0 % (ref 0–0)
PLT EST: ADEQUATE
PROMYELO: 0 % (ref 0–0)
Variant Lymph: 28 % — ABNORMAL HIGH (ref 0–0)

## 2006-06-26 LAB — PROTIME-INR
INR: 2.6 (ref 2.00–3.50)
Protime: 31.2 Seconds — ABNORMAL HIGH (ref 10.6–13.4)

## 2006-06-26 LAB — CBC WITH DIFFERENTIAL/PLATELET
Basophils Absolute: 0 10*3/uL (ref 0.0–0.1)
EOS%: 3.2 % (ref 0.0–7.0)
Eosinophils Absolute: 0.2 10*3/uL (ref 0.0–0.5)
HGB: 10.2 g/dL — ABNORMAL LOW (ref 11.6–15.9)
MONO#: 0.3 10*3/uL (ref 0.1–0.9)
NEUT#: 0.3 10*3/uL — CL (ref 1.5–6.5)
RDW: 11.7 % (ref 11.3–14.5)
WBC: 6.6 10*3/uL (ref 3.9–10.0)
lymph#: 5.7 10*3/uL — ABNORMAL HIGH (ref 0.9–3.3)

## 2006-06-26 LAB — LACTATE DEHYDROGENASE: LDH: 407 U/L — ABNORMAL HIGH (ref 94–250)

## 2006-06-26 LAB — MORPHOLOGY: PLT EST: ADEQUATE

## 2006-06-26 LAB — COMPREHENSIVE METABOLIC PANEL
ALT: 12 U/L (ref 0–35)
AST: 18 U/L (ref 0–37)
Creatinine, Ser: 0.74 mg/dL (ref 0.40–1.20)
Total Bilirubin: 0.3 mg/dL (ref 0.3–1.2)

## 2006-07-04 LAB — MANUAL DIFFERENTIAL
ANC (CHCC manual diff): 0.1 10*3/uL — CL (ref 1.5–6.5)
Band Neutrophils: 0 % (ref 0–10)
Basophil: 0 % (ref 0–2)
Blasts: 0 % (ref 0–0)
EOS: 0 % (ref 0–7)
LYMPH: 99 % — ABNORMAL HIGH (ref 14–49)
Other Cell: 0 % (ref 0–0)
PLT EST: DECREASED
SEG: 1 % — ABNORMAL LOW (ref 38–77)
nRBC: 0 % (ref 0–0)

## 2006-07-04 LAB — CBC WITH DIFFERENTIAL/PLATELET
MCH: 24.5 pg — ABNORMAL LOW (ref 26.0–34.0)
MCV: 79.4 fL — ABNORMAL LOW (ref 81.0–101.0)
RBC: 4.31 10*6/uL (ref 3.70–5.32)
RDW: 11.8 % (ref 11.3–14.5)

## 2006-07-10 LAB — CBC WITH DIFFERENTIAL/PLATELET
MCH: 24.2 pg — ABNORMAL LOW (ref 26.0–34.0)
MCHC: 30.7 g/dL — ABNORMAL LOW (ref 32.0–36.0)
Platelets: 184 10*3/uL (ref 145–400)
RBC: 4.22 10*6/uL (ref 3.70–5.32)

## 2006-07-10 LAB — MANUAL DIFFERENTIAL
Band Neutrophils: 0 % (ref 0–10)
EOS: 1 % (ref 0–7)
LYMPH: 93 % — ABNORMAL HIGH (ref 14–49)
MONO: 5 % (ref 0–14)
Myelocytes: 0 % (ref 0–0)
Other Cell: 0 % (ref 0–0)
SEG: 1 % — ABNORMAL LOW (ref 38–77)
Variant Lymph: 0 % (ref 0–0)
nRBC: 0 % (ref 0–0)

## 2006-07-10 LAB — PROTIME-INR: INR: 1.9 — ABNORMAL LOW (ref 2.00–3.50)

## 2006-07-13 ENCOUNTER — Ambulatory Visit: Payer: Self-pay | Admitting: Oncology

## 2006-07-17 LAB — CBC WITH DIFFERENTIAL/PLATELET
HCT: 31.9 % — ABNORMAL LOW (ref 34.8–46.6)
HGB: 9.8 g/dL — ABNORMAL LOW (ref 11.6–15.9)
RDW: 12.1 % (ref 11.3–14.5)
WBC: 11 10*3/uL — ABNORMAL HIGH (ref 3.9–10.0)

## 2006-07-17 LAB — MANUAL DIFFERENTIAL
ANC (CHCC manual diff): 0 10*3/uL — CL (ref 1.5–6.5)
Band Neutrophils: 0 % (ref 0–10)
Blasts: 0 % (ref 0–0)
Other Cell: 0 % (ref 0–0)
PROMYELO: 0 % (ref 0–0)
SEG: 0 % — ABNORMAL LOW (ref 38–77)
Variant Lymph: 39 % — ABNORMAL HIGH (ref 0–0)
nRBC: 0 % (ref 0–0)

## 2006-07-22 ENCOUNTER — Inpatient Hospital Stay (HOSPITAL_COMMUNITY): Admission: AD | Admit: 2006-07-22 | Discharge: 2006-07-25 | Payer: Self-pay | Admitting: Oncology

## 2006-07-22 ENCOUNTER — Ambulatory Visit: Payer: Self-pay | Admitting: Oncology

## 2006-07-31 LAB — COMPREHENSIVE METABOLIC PANEL
Alkaline Phosphatase: 98 U/L (ref 39–117)
CO2: 23 mEq/L (ref 19–32)
Creatinine, Ser: 0.78 mg/dL (ref 0.40–1.20)
Glucose, Bld: 109 mg/dL — ABNORMAL HIGH (ref 70–99)
Sodium: 140 mEq/L (ref 135–145)
Total Bilirubin: 0.3 mg/dL (ref 0.3–1.2)

## 2006-07-31 LAB — CBC WITH DIFFERENTIAL/PLATELET
HCT: 32.1 % — ABNORMAL LOW (ref 34.8–46.6)
HGB: 9.8 g/dL — ABNORMAL LOW (ref 11.6–15.9)
MCH: 23.3 pg — ABNORMAL LOW (ref 26.0–34.0)
MCV: 76.5 fL — ABNORMAL LOW (ref 81.0–101.0)
Platelets: 306 10*3/uL (ref 145–400)

## 2006-07-31 LAB — MANUAL DIFFERENTIAL
ALC: 2.5 10*3/uL (ref 0.9–3.3)
ANC (CHCC manual diff): 0.2 10*3/uL — CL (ref 1.5–6.5)
Band Neutrophils: 0 % (ref 0–10)
Blasts: 0 % (ref 0–0)
LYMPH: 63 % — ABNORMAL HIGH (ref 14–49)
Metamyelocytes: 0 % (ref 0–0)
PLT EST: ADEQUATE
SEG: 7 % — ABNORMAL LOW (ref 38–77)
Variant Lymph: 24 % — ABNORMAL HIGH (ref 0–0)

## 2006-07-31 LAB — URIC ACID: Uric Acid, Serum: 5.1 mg/dL (ref 2.4–7.0)

## 2006-07-31 LAB — LACTATE DEHYDROGENASE: LDH: 174 U/L (ref 94–250)

## 2006-07-31 LAB — PROTIME-INR: Protime: 24 Seconds — ABNORMAL HIGH (ref 10.6–13.4)

## 2006-08-07 LAB — CBC WITH DIFFERENTIAL/PLATELET
Basophils Absolute: 0.1 10*3/uL (ref 0.0–0.1)
EOS%: 1 % (ref 0.0–7.0)
Eosinophils Absolute: 0.1 10*3/uL (ref 0.0–0.5)
HCT: 32.2 % — ABNORMAL LOW (ref 34.8–46.6)
HGB: 9.8 g/dL — ABNORMAL LOW (ref 11.6–15.9)
MCH: 23.1 pg — ABNORMAL LOW (ref 26.0–34.0)
MONO#: 0.6 10*3/uL (ref 0.1–0.9)
NEUT#: 1.2 10*3/uL — ABNORMAL LOW (ref 1.5–6.5)
NEUT%: 17.4 % — ABNORMAL LOW (ref 39.6–76.8)
RDW: 13.1 % (ref 11.3–14.5)
WBC: 6.9 10*3/uL (ref 3.9–10.0)
lymph#: 4.9 10*3/uL — ABNORMAL HIGH (ref 0.9–3.3)

## 2006-08-07 LAB — MORPHOLOGY: PLT EST: ADEQUATE

## 2006-08-14 LAB — CBC WITH DIFFERENTIAL/PLATELET
Basophils Absolute: 0.1 10*3/uL (ref 0.0–0.1)
EOS%: 1 % (ref 0.0–7.0)
Eosinophils Absolute: 0.1 10*3/uL (ref 0.0–0.5)
LYMPH%: 75.6 % — ABNORMAL HIGH (ref 14.0–48.0)
MCH: 23.1 pg — ABNORMAL LOW (ref 26.0–34.0)
MCV: 74.9 fL — ABNORMAL LOW (ref 81.0–101.0)
MONO%: 6.3 % (ref 0.0–13.0)
NEUT#: 1.9 10*3/uL (ref 1.5–6.5)
Platelets: 153 10*3/uL (ref 145–400)
RBC: 4.15 10*6/uL (ref 3.70–5.32)

## 2006-08-14 LAB — MORPHOLOGY

## 2006-08-21 LAB — CBC WITH DIFFERENTIAL/PLATELET
EOS%: 1.2 % (ref 0.0–7.0)
LYMPH%: 77.4 % — ABNORMAL HIGH (ref 14.0–48.0)
MCH: 24.6 pg — ABNORMAL LOW (ref 26.0–34.0)
MCV: 74.3 fL — ABNORMAL LOW (ref 81.0–101.0)
MONO%: 10.3 % (ref 0.0–13.0)
Platelets: 216 10*3/uL (ref 145–400)
RBC: 4.01 10*6/uL (ref 3.70–5.32)
RDW: 17.1 % — ABNORMAL HIGH (ref 11.3–14.5)

## 2006-08-21 LAB — MORPHOLOGY: PLT EST: ADEQUATE

## 2006-08-23 ENCOUNTER — Ambulatory Visit: Payer: Self-pay | Admitting: Oncology

## 2006-08-28 LAB — CBC WITH DIFFERENTIAL/PLATELET
BASO%: 0.2 % (ref 0.0–2.0)
Basophils Absolute: 0 10*3/uL (ref 0.0–0.1)
HCT: 33.6 % — ABNORMAL LOW (ref 34.8–46.6)
HGB: 10.2 g/dL — ABNORMAL LOW (ref 11.6–15.9)
LYMPH%: 85 % — ABNORMAL HIGH (ref 14.0–48.0)
MCH: 23 pg — ABNORMAL LOW (ref 26.0–34.0)
MCHC: 30.2 g/dL — ABNORMAL LOW (ref 32.0–36.0)
MONO#: 0.9 10*3/uL (ref 0.1–0.9)
NEUT%: 9.6 % — ABNORMAL LOW (ref 39.6–76.8)
Platelets: 222 10*3/uL (ref 145–400)
WBC: 21.6 10*3/uL — ABNORMAL HIGH (ref 3.9–10.0)
lymph#: 18.4 10*3/uL — ABNORMAL HIGH (ref 0.9–3.3)

## 2006-08-28 LAB — PROTIME-INR

## 2006-08-31 ENCOUNTER — Encounter: Admission: RE | Admit: 2006-08-31 | Discharge: 2006-08-31 | Payer: Self-pay | Admitting: Oncology

## 2006-09-04 ENCOUNTER — Ambulatory Visit: Payer: Self-pay | Admitting: Psychiatry

## 2006-09-04 LAB — CBC WITH DIFFERENTIAL/PLATELET
Basophils Absolute: 0.2 10*3/uL — ABNORMAL HIGH (ref 0.0–0.1)
EOS%: 1.9 % (ref 0.0–7.0)
Eosinophils Absolute: 0.2 10*3/uL (ref 0.0–0.5)
HCT: 31.1 % — ABNORMAL LOW (ref 34.8–46.6)
HGB: 10.1 g/dL — ABNORMAL LOW (ref 11.6–15.9)
MCH: 24.1 pg — ABNORMAL LOW (ref 26.0–34.0)
MCV: 74.1 fL — ABNORMAL LOW (ref 81.0–101.0)
MONO%: 13.3 % — ABNORMAL HIGH (ref 0.0–13.0)
NEUT%: 14 % — ABNORMAL LOW (ref 39.6–76.8)

## 2006-09-04 LAB — MORPHOLOGY: PLT EST: ADEQUATE

## 2006-09-11 LAB — CBC WITH DIFFERENTIAL/PLATELET
HGB: 9.7 g/dL — ABNORMAL LOW (ref 11.6–15.9)
MCV: 77.2 fL — ABNORMAL LOW (ref 81.0–101.0)
Platelets: 184 10*3/uL (ref 145–400)
RDW: 14.2 % (ref 11.3–14.5)
WBC: 23.8 10*3/uL — ABNORMAL HIGH (ref 3.9–10.0)

## 2006-09-11 LAB — MANUAL DIFFERENTIAL
ALC: 20 10*3/uL — ABNORMAL HIGH (ref 0.9–3.3)
Band Neutrophils: 0 % (ref 0–10)
EOS: 1 % (ref 0–7)
Myelocytes: 0 % (ref 0–0)
Other Cell: 0 % (ref 0–0)
PROMYELO: 0 % (ref 0–0)
SEG: 14 % — ABNORMAL LOW (ref 38–77)
Variant Lymph: 39 % — ABNORMAL HIGH (ref 0–0)
nRBC: 0 % (ref 0–0)

## 2006-09-18 LAB — CBC WITH DIFFERENTIAL/PLATELET
HCT: 31.9 % — ABNORMAL LOW (ref 34.8–46.6)
HGB: 9.4 g/dL — ABNORMAL LOW (ref 11.6–15.9)
MCH: 23.1 pg — ABNORMAL LOW (ref 26.0–34.0)
MCV: 78.5 fL — ABNORMAL LOW (ref 81.0–101.0)
Platelets: 185 10*3/uL (ref 145–400)
WBC: 34 10*3/uL — ABNORMAL HIGH (ref 3.9–10.0)

## 2006-09-18 LAB — MANUAL DIFFERENTIAL
ALC: 32 10*3/uL — ABNORMAL HIGH (ref 0.9–3.3)
ANC (CHCC manual diff): 0.7 10*3/uL — ABNORMAL LOW (ref 1.5–6.5)
Blasts: 0 % (ref 0–0)
MONO: 4 % (ref 0–14)
Metamyelocytes: 0 % (ref 0–0)
Myelocytes: 0 % (ref 0–0)
Other Cell: 0 % (ref 0–0)
PLT EST: ADEQUATE
PROMYELO: 0 % (ref 0–0)
SEG: 2 % — ABNORMAL LOW (ref 38–77)
Variant Lymph: 55 % — ABNORMAL HIGH (ref 0–0)

## 2006-09-18 LAB — PROTIME-INR: INR: 2.2 (ref 2.00–3.50)

## 2006-09-25 LAB — COMPREHENSIVE METABOLIC PANEL
ALT: 10 U/L (ref 0–35)
AST: 13 U/L (ref 0–37)
Alkaline Phosphatase: 117 U/L (ref 39–117)
BUN: 10 mg/dL (ref 6–23)
Creatinine, Ser: 0.8 mg/dL (ref 0.40–1.20)
Potassium: 4.1 mEq/L (ref 3.5–5.3)

## 2006-09-25 LAB — CBC WITH DIFFERENTIAL/PLATELET
Eosinophils Absolute: 0.9 10*3/uL — ABNORMAL HIGH (ref 0.0–0.5)
HCT: 34.1 % — ABNORMAL LOW (ref 34.8–46.6)
LYMPH%: 83.5 % — ABNORMAL HIGH (ref 14.0–48.0)
MONO#: 0.9 10*3/uL (ref 0.1–0.9)
NEUT#: 0.7 10*3/uL — ABNORMAL LOW (ref 1.5–6.5)
NEUT%: 4.5 % — ABNORMAL LOW (ref 39.6–76.8)
Platelets: 218 10*3/uL (ref 145–400)
WBC: 16.2 10*3/uL — ABNORMAL HIGH (ref 3.9–10.0)
lymph#: 13.5 10*3/uL — ABNORMAL HIGH (ref 0.9–3.3)

## 2006-09-25 LAB — MORPHOLOGY
PLT EST: ADEQUATE
White Cell Comments: 28

## 2006-09-25 LAB — PROTIME-INR: INR: 1.8 — ABNORMAL LOW (ref 2.00–3.50)

## 2006-10-01 ENCOUNTER — Inpatient Hospital Stay (HOSPITAL_COMMUNITY): Admission: AD | Admit: 2006-10-01 | Discharge: 2006-10-05 | Payer: Self-pay | Admitting: Oncology

## 2006-10-01 ENCOUNTER — Ambulatory Visit: Payer: Self-pay | Admitting: Oncology

## 2006-10-05 ENCOUNTER — Ambulatory Visit: Payer: Self-pay | Admitting: Oncology

## 2006-10-10 LAB — CBC WITH DIFFERENTIAL/PLATELET
Basophils Absolute: 0.2 10*3/uL — ABNORMAL HIGH (ref 0.0–0.1)
Eosinophils Absolute: 3.8 10*3/uL — ABNORMAL HIGH (ref 0.0–0.5)
HGB: 10 g/dL — ABNORMAL LOW (ref 11.6–15.9)
LYMPH%: 56.7 % — ABNORMAL HIGH (ref 14.0–48.0)
MCH: 24.4 pg — ABNORMAL LOW (ref 26.0–34.0)
MCV: 77.3 fL — ABNORMAL LOW (ref 81.0–101.0)
MONO%: 13.7 % — ABNORMAL HIGH (ref 0.0–13.0)
NEUT#: 1.5 10*3/uL (ref 1.5–6.5)
NEUT%: 8.1 % — ABNORMAL LOW (ref 39.6–76.8)
Platelets: 175 10*3/uL (ref 145–400)

## 2006-10-10 LAB — MORPHOLOGY: PLT EST: ADEQUATE

## 2006-10-16 LAB — PROTIME-INR
INR: 3.6 — ABNORMAL HIGH (ref 2.00–3.50)
Protime: 43.2 Seconds — ABNORMAL HIGH (ref 10.6–13.4)

## 2006-10-16 LAB — MANUAL DIFFERENTIAL
ANC (CHCC manual diff): 1.2 10*3/uL — ABNORMAL LOW (ref 1.5–6.5)
LYMPH: 66 % — ABNORMAL HIGH (ref 14–49)
Variant Lymph: 4 % — ABNORMAL HIGH (ref 0–0)

## 2006-10-16 LAB — CBC WITH DIFFERENTIAL/PLATELET
MCHC: 30.7 g/dL — ABNORMAL LOW (ref 32.0–36.0)
Platelets: 243 10*3/uL (ref 145–400)
RBC: 4.1 10*6/uL (ref 3.70–5.32)
RDW: 13.3 % (ref 11.3–14.5)

## 2006-10-23 LAB — MANUAL DIFFERENTIAL
ALC: 12.2 10*3/uL — ABNORMAL HIGH (ref 0.9–3.3)
Basophil: 0 % (ref 0–2)
EOS: 4 % (ref 0–7)
MONO: 1 % (ref 0–14)
Metamyelocytes: 0 % (ref 0–0)
Myelocytes: 0 % (ref 0–0)
PLT EST: ADEQUATE

## 2006-10-23 LAB — CBC WITH DIFFERENTIAL/PLATELET
MCHC: 31.3 g/dL — ABNORMAL LOW (ref 32.0–36.0)
MCV: 75.9 fL — ABNORMAL LOW (ref 81.0–101.0)
Platelets: 197 10*3/uL (ref 145–400)
RBC: 3.88 10*6/uL (ref 3.70–5.32)

## 2006-10-30 LAB — MORPHOLOGY

## 2006-10-30 LAB — CBC WITH DIFFERENTIAL/PLATELET
Basophils Absolute: 0.2 10*3/uL — ABNORMAL HIGH (ref 0.0–0.1)
EOS%: 5 % (ref 0.0–7.0)
Eosinophils Absolute: 1.2 10*3/uL — ABNORMAL HIGH (ref 0.0–0.5)
HCT: 30.9 % — ABNORMAL LOW (ref 34.8–46.6)
HGB: 9.4 g/dL — ABNORMAL LOW (ref 11.6–15.9)
MCH: 23.4 pg — ABNORMAL LOW (ref 26.0–34.0)
MCV: 76.9 fL — ABNORMAL LOW (ref 81.0–101.0)
MONO%: 4.2 % (ref 0.0–13.0)
NEUT%: 3.9 % — ABNORMAL LOW (ref 39.6–76.8)
lymph#: 21.1 10*3/uL — ABNORMAL HIGH (ref 0.9–3.3)

## 2006-10-30 LAB — PROTIME-INR
INR: 2.2 (ref 2.00–3.50)
Protime: 26.4 Seconds — ABNORMAL HIGH (ref 10.6–13.4)

## 2006-10-30 LAB — COMPREHENSIVE METABOLIC PANEL
AST: 13 U/L (ref 0–37)
Alkaline Phosphatase: 136 U/L — ABNORMAL HIGH (ref 39–117)
BUN: 9 mg/dL (ref 6–23)
Creatinine, Ser: 0.7 mg/dL (ref 0.40–1.20)
Glucose, Bld: 95 mg/dL (ref 70–99)
Total Bilirubin: 0.4 mg/dL (ref 0.3–1.2)

## 2006-11-06 LAB — CBC WITH DIFFERENTIAL/PLATELET
Eosinophils Absolute: 1.1 10*3/uL — ABNORMAL HIGH (ref 0.0–0.5)
MCV: 76.1 fL — ABNORMAL LOW (ref 81.0–101.0)
MONO#: 0.7 10*3/uL (ref 0.1–0.9)
MONO%: 4.8 % (ref 0.0–13.0)
NEUT#: 1.7 10*3/uL (ref 1.5–6.5)
RBC: 4.26 10*6/uL (ref 3.70–5.32)
RDW: 13.5 % (ref 11.3–14.5)
WBC: 14.9 10*3/uL — ABNORMAL HIGH (ref 3.9–10.0)
lymph#: 11.3 10*3/uL — ABNORMAL HIGH (ref 0.9–3.3)

## 2006-11-06 LAB — MORPHOLOGY

## 2006-11-13 LAB — CBC WITH DIFFERENTIAL/PLATELET
BASO%: 0.7 % (ref 0.0–2.0)
Eosinophils Absolute: 0.6 10*3/uL — ABNORMAL HIGH (ref 0.0–0.5)
HCT: 33.9 % — ABNORMAL LOW (ref 34.8–46.6)
LYMPH%: 68.9 % — ABNORMAL HIGH (ref 14.0–48.0)
MCHC: 29.8 g/dL — ABNORMAL LOW (ref 32.0–36.0)
MONO#: 0.6 10*3/uL (ref 0.1–0.9)
NEUT%: 10.5 % — ABNORMAL LOW (ref 39.6–76.8)
Platelets: 135 10*3/uL — ABNORMAL LOW (ref 145–400)
WBC: 5.9 10*3/uL (ref 3.9–10.0)

## 2006-11-13 LAB — TECHNOLOGIST REVIEW

## 2006-11-23 ENCOUNTER — Ambulatory Visit: Payer: Self-pay | Admitting: Oncology

## 2006-11-27 LAB — MANUAL DIFFERENTIAL
Basophil: 0 % (ref 0–2)
EOS: 3 % (ref 0–7)
MONO: 1 % (ref 0–14)
Metamyelocytes: 0 % (ref 0–0)
Myelocytes: 0 % (ref 0–0)
Other Cell: 0 % (ref 0–0)
PLT EST: ADEQUATE

## 2006-11-27 LAB — CBC WITH DIFFERENTIAL/PLATELET
HCT: 31.5 % — ABNORMAL LOW (ref 34.8–46.6)
HGB: 9.5 g/dL — ABNORMAL LOW (ref 11.6–15.9)
Platelets: 204 10*3/uL (ref 145–400)
WBC: 15 10*3/uL — ABNORMAL HIGH (ref 3.9–10.0)

## 2006-11-27 LAB — COMPREHENSIVE METABOLIC PANEL
AST: 15 U/L (ref 0–37)
Albumin: 4 g/dL (ref 3.5–5.2)
Alkaline Phosphatase: 106 U/L (ref 39–117)
BUN: 8 mg/dL (ref 6–23)
Potassium: 3.4 mEq/L — ABNORMAL LOW (ref 3.5–5.3)

## 2006-11-27 LAB — PROTIME-INR
INR: 1.9 — ABNORMAL LOW (ref 2.00–3.50)
Protime: 22.8 Seconds — ABNORMAL HIGH (ref 10.6–13.4)

## 2006-12-04 LAB — COMPREHENSIVE METABOLIC PANEL
Albumin: 4.1 g/dL (ref 3.5–5.2)
Alkaline Phosphatase: 110 U/L (ref 39–117)
CO2: 22 mEq/L (ref 19–32)
Glucose, Bld: 138 mg/dL — ABNORMAL HIGH (ref 70–99)
Potassium: 3.6 mEq/L (ref 3.5–5.3)
Sodium: 142 mEq/L (ref 135–145)
Total Protein: 6.8 g/dL (ref 6.0–8.3)

## 2006-12-04 LAB — CBC WITH DIFFERENTIAL/PLATELET
Basophils Absolute: 0.2 10*3/uL — ABNORMAL HIGH (ref 0.0–0.1)
Eosinophils Absolute: 0.8 10*3/uL — ABNORMAL HIGH (ref 0.0–0.5)
HGB: 9.9 g/dL — ABNORMAL LOW (ref 11.6–15.9)
LYMPH%: 87.9 % — ABNORMAL HIGH (ref 14.0–48.0)
MCV: 79 fL — ABNORMAL LOW (ref 81.0–101.0)
MONO%: 3.5 % (ref 0.0–13.0)
NEUT#: 1.2 10*3/uL — ABNORMAL LOW (ref 1.5–6.5)
Platelets: 257 10*3/uL (ref 145–400)
RBC: 4.14 10*6/uL (ref 3.70–5.32)

## 2006-12-04 LAB — URIC ACID: Uric Acid, Serum: 6.3 mg/dL (ref 2.4–7.0)

## 2006-12-04 LAB — PROTIME-INR: Protime: 20.4 Seconds — ABNORMAL HIGH (ref 10.6–13.4)

## 2006-12-11 ENCOUNTER — Ambulatory Visit (HOSPITAL_COMMUNITY): Admission: RE | Admit: 2006-12-11 | Discharge: 2006-12-11 | Payer: Self-pay | Admitting: Oncology

## 2006-12-11 LAB — PROTIME-INR: INR: 1.7 — ABNORMAL LOW (ref 2.00–3.50)

## 2006-12-17 LAB — CBC WITH DIFFERENTIAL/PLATELET
BASO%: 1.5 % (ref 0.0–2.0)
EOS%: 3.8 % (ref 0.0–7.0)
HCT: 34.9 % (ref 34.8–46.6)
LYMPH%: 81.3 % — ABNORMAL HIGH (ref 14.0–48.0)
MCH: 23.6 pg — ABNORMAL LOW (ref 26.0–34.0)
MCHC: 30.1 g/dL — ABNORMAL LOW (ref 32.0–36.0)
MCV: 78.3 fL — ABNORMAL LOW (ref 81.0–101.0)
MONO%: 8.7 % (ref 0.0–13.0)
NEUT%: 4.7 % — ABNORMAL LOW (ref 39.6–76.8)
lymph#: 5.9 10*3/uL — ABNORMAL HIGH (ref 0.9–3.3)

## 2006-12-17 LAB — PROTIME-INR: INR: 1.9 — ABNORMAL LOW (ref 2.00–3.50)

## 2007-01-01 LAB — PROTIME-INR: INR: 1.4 — ABNORMAL LOW (ref 2.00–3.50)

## 2007-01-01 LAB — MANUAL DIFFERENTIAL
ALC: 1.2 10*3/uL (ref 0.9–3.3)
ANC (CHCC manual diff): 0 10*3/uL — CL (ref 1.5–6.5)
Blasts: 0 % (ref 0–0)
Metamyelocytes: 0 % (ref 0–0)
Myelocytes: 0 % (ref 0–0)
Other Cell: 0 % (ref 0–0)
SEG: 0 % — ABNORMAL LOW (ref 38–77)
Variant Lymph: 0 % (ref 0–0)

## 2007-01-01 LAB — CBC WITH DIFFERENTIAL/PLATELET
HCT: 29.1 % — ABNORMAL LOW (ref 34.8–46.6)
MCHC: 32.8 g/dL (ref 32.0–36.0)
Platelets: 188 10*3/uL (ref 145–400)
RBC: 3.96 10*6/uL (ref 3.70–5.32)
WBC: 1.6 10*3/uL — ABNORMAL LOW (ref 3.9–10.0)

## 2007-01-01 LAB — COMPREHENSIVE METABOLIC PANEL
AST: 14 U/L (ref 0–37)
Albumin: 3.9 g/dL (ref 3.5–5.2)
Alkaline Phosphatase: 180 U/L — ABNORMAL HIGH (ref 39–117)
Glucose, Bld: 99 mg/dL (ref 70–99)
Potassium: 3.4 mEq/L — ABNORMAL LOW (ref 3.5–5.3)
Sodium: 141 mEq/L (ref 135–145)
Total Bilirubin: 0.5 mg/dL (ref 0.3–1.2)
Total Protein: 6.6 g/dL (ref 6.0–8.3)

## 2007-01-01 LAB — URIC ACID: Uric Acid, Serum: 5.7 mg/dL (ref 2.4–7.0)

## 2007-01-04 LAB — PROTIME-INR

## 2007-01-08 ENCOUNTER — Ambulatory Visit: Payer: Self-pay | Admitting: Oncology

## 2007-01-08 LAB — MANUAL DIFFERENTIAL
ANC (CHCC manual diff): 0.1 10*3/uL — CL (ref 1.5–6.5)
Band Neutrophils: 0 % (ref 0–10)
Basophil: 1 % (ref 0–2)
Blasts: 0 % (ref 0–0)
EOS: 11 % — ABNORMAL HIGH (ref 0–7)
LYMPH: 70 % — ABNORMAL HIGH (ref 14–49)
Metamyelocytes: 0 % (ref 0–0)
PLT EST: ADEQUATE
PROMYELO: 0 % (ref 0–0)
nRBC: 0 % (ref 0–0)

## 2007-01-08 LAB — CBC WITH DIFFERENTIAL/PLATELET
HGB: 9.6 g/dL — ABNORMAL LOW (ref 11.6–15.9)
MCV: 76.4 fL — ABNORMAL LOW (ref 81.0–101.0)
Platelets: 145 10*3/uL (ref 145–400)
RBC: 4.09 10*6/uL (ref 3.70–5.32)
RDW: 12.8 % (ref 11.3–14.5)
WBC: 3.3 10*3/uL — ABNORMAL LOW (ref 3.9–10.0)

## 2007-01-08 LAB — PROTIME-INR: Protime: 19.2 Seconds — ABNORMAL HIGH (ref 10.6–13.4)

## 2007-01-15 LAB — CBC WITH DIFFERENTIAL/PLATELET
BASO%: 3.3 % — ABNORMAL HIGH (ref 0.0–2.0)
EOS%: 7.3 % — ABNORMAL HIGH (ref 0.0–7.0)
HCT: 31.2 % — ABNORMAL LOW (ref 34.8–46.6)
MCH: 23 pg — ABNORMAL LOW (ref 26.0–34.0)
MCHC: 30.8 g/dL — ABNORMAL LOW (ref 32.0–36.0)
MONO#: 1.1 10*3/uL — ABNORMAL HIGH (ref 0.1–0.9)
RDW: 12.2 % (ref 11.3–14.5)
WBC: 3.7 10*3/uL — ABNORMAL LOW (ref 3.9–10.0)
lymph#: 1.8 10*3/uL (ref 0.9–3.3)

## 2007-01-21 LAB — CBC WITH DIFFERENTIAL/PLATELET
Basophils Absolute: 0.1 10*3/uL (ref 0.0–0.1)
EOS%: 8.5 % — ABNORMAL HIGH (ref 0.0–7.0)
Eosinophils Absolute: 0.4 10*3/uL (ref 0.0–0.5)
HCT: 34.2 % — ABNORMAL LOW (ref 34.8–46.6)
HGB: 10.5 g/dL — ABNORMAL LOW (ref 11.6–15.9)
MCH: 23.4 pg — ABNORMAL LOW (ref 26.0–34.0)
MONO#: 0.6 10*3/uL (ref 0.1–0.9)
NEUT#: 0.8 10*3/uL — ABNORMAL LOW (ref 1.5–6.5)
NEUT%: 16.9 % — ABNORMAL LOW (ref 39.6–76.8)
RDW: 12.4 % (ref 11.3–14.5)
WBC: 4.6 10*3/uL (ref 3.9–10.0)
lymph#: 2.8 10*3/uL (ref 0.9–3.3)

## 2007-01-28 LAB — CBC WITH DIFFERENTIAL/PLATELET
Basophils Absolute: 0.1 10*3/uL (ref 0.0–0.1)
Eosinophils Absolute: 0.4 10*3/uL (ref 0.0–0.5)
HCT: 32.4 % — ABNORMAL LOW (ref 34.8–46.6)
HGB: 10.1 g/dL — ABNORMAL LOW (ref 11.6–15.9)
MCV: 75 fL — ABNORMAL LOW (ref 81.0–101.0)
MONO%: 9.4 % (ref 0.0–13.0)
NEUT#: 1 10*3/uL — ABNORMAL LOW (ref 1.5–6.5)
NEUT%: 19.8 % — ABNORMAL LOW (ref 39.6–76.8)
RDW: 12.2 % (ref 11.3–14.5)

## 2007-01-28 LAB — PROTIME-INR: INR: 2.3 (ref 2.00–3.50)

## 2007-02-01 LAB — COMPREHENSIVE METABOLIC PANEL
ALT: 10 U/L (ref 0–35)
AST: 13 U/L (ref 0–37)
Alkaline Phosphatase: 129 U/L — ABNORMAL HIGH (ref 39–117)
BUN: 12 mg/dL (ref 6–23)
Chloride: 109 mEq/L (ref 96–112)
Creatinine, Ser: 0.71 mg/dL (ref 0.40–1.20)

## 2007-02-01 LAB — CBC WITH DIFFERENTIAL/PLATELET
BASO%: 0.3 % (ref 0.0–2.0)
Basophils Absolute: 0 10*3/uL (ref 0.0–0.1)
EOS%: 8 % — ABNORMAL HIGH (ref 0.0–7.0)
HCT: 31.4 % — ABNORMAL LOW (ref 34.8–46.6)
HGB: 10 g/dL — ABNORMAL LOW (ref 11.6–15.9)
LYMPH%: 60 % — ABNORMAL HIGH (ref 14.0–48.0)
MCH: 23.4 pg — ABNORMAL LOW (ref 26.0–34.0)
MCHC: 32 g/dL (ref 32.0–36.0)
MONO#: 0.5 10*3/uL (ref 0.1–0.9)
NEUT%: 21.4 % — ABNORMAL LOW (ref 39.6–76.8)
Platelets: 190 10*3/uL (ref 145–400)
lymph#: 2.9 10*3/uL (ref 0.9–3.3)

## 2007-02-01 LAB — PROTIME-INR
INR: 2.4 (ref 2.00–3.50)
Protime: 28.8 Seconds — ABNORMAL HIGH (ref 10.6–13.4)

## 2007-02-01 LAB — MORPHOLOGY: PLT EST: ADEQUATE

## 2007-02-05 LAB — CBC WITH DIFFERENTIAL/PLATELET
Basophils Absolute: 0.1 10*3/uL (ref 0.0–0.1)
Eosinophils Absolute: 0.4 10*3/uL (ref 0.0–0.5)
HGB: 9.6 g/dL — ABNORMAL LOW (ref 11.6–15.9)
MONO#: 0.8 10*3/uL (ref 0.1–0.9)
MONO%: 14.5 % — ABNORMAL HIGH (ref 0.0–13.0)
NEUT#: 1.3 10*3/uL — ABNORMAL LOW (ref 1.5–6.5)
RBC: 4.11 10*6/uL (ref 3.70–5.32)
RDW: 12.1 % (ref 11.3–14.5)
WBC: 5.8 10*3/uL (ref 3.9–10.0)
lymph#: 3.2 10*3/uL (ref 0.9–3.3)

## 2007-02-05 LAB — PROTIME-INR
INR: 1.1 — ABNORMAL LOW (ref 2.00–3.50)
Protime: 13.2 Seconds (ref 10.6–13.4)

## 2007-02-05 LAB — MORPHOLOGY: PLT EST: UNDETERMINED

## 2007-02-06 ENCOUNTER — Encounter: Admission: AD | Admit: 2007-02-06 | Discharge: 2007-02-06 | Payer: Self-pay | Admitting: Dentistry

## 2007-02-06 ENCOUNTER — Ambulatory Visit: Payer: Self-pay | Admitting: Dentistry

## 2007-02-12 LAB — CBC WITH DIFFERENTIAL/PLATELET
BASO%: 0.3 % (ref 0.0–2.0)
LYMPH%: 19.1 % (ref 14.0–48.0)
MCHC: 32.8 g/dL (ref 32.0–36.0)
MONO#: 1.3 10*3/uL — ABNORMAL HIGH (ref 0.1–0.9)
MONO%: 41.3 % — ABNORMAL HIGH (ref 0.0–13.0)
Platelets: 134 10*3/uL — ABNORMAL LOW (ref 145–400)
RBC: 4.34 10*6/uL (ref 3.70–5.32)
RDW: 15.5 % — ABNORMAL HIGH (ref 11.3–14.5)
WBC: 3 10*3/uL — ABNORMAL LOW (ref 3.9–10.0)

## 2007-02-12 LAB — MORPHOLOGY

## 2007-02-15 ENCOUNTER — Ambulatory Visit: Payer: Self-pay | Admitting: Oncology

## 2007-02-19 LAB — CBC WITH DIFFERENTIAL/PLATELET
EOS%: 15 % — ABNORMAL HIGH (ref 0.0–7.0)
Eosinophils Absolute: 0.3 10*3/uL (ref 0.0–0.5)
MCV: 76.2 fL — ABNORMAL LOW (ref 81.0–101.0)
MONO%: 9.9 % (ref 0.0–13.0)
NEUT#: 0.7 10*3/uL — ABNORMAL LOW (ref 1.5–6.5)
RBC: 4.03 10*6/uL (ref 3.70–5.32)
RDW: 12.1 % (ref 11.3–14.5)
lymph#: 0.6 10*3/uL — ABNORMAL LOW (ref 0.9–3.3)

## 2007-02-19 LAB — MORPHOLOGY: PLT EST: ADEQUATE

## 2007-03-01 LAB — CBC WITH DIFFERENTIAL/PLATELET
BASO%: 0.6 % (ref 0.0–2.0)
EOS%: 17.4 % — ABNORMAL HIGH (ref 0.0–7.0)
HCT: 31.2 % — ABNORMAL LOW (ref 34.8–46.6)
LYMPH%: 36.8 % (ref 14.0–48.0)
MCH: 23 pg — ABNORMAL LOW (ref 26.0–34.0)
MCHC: 31.7 g/dL — ABNORMAL LOW (ref 32.0–36.0)
MONO%: 18.9 % — ABNORMAL HIGH (ref 0.0–13.0)
NEUT%: 26.3 % — ABNORMAL LOW (ref 39.6–76.8)
Platelets: 222 10*3/uL (ref 145–400)

## 2007-03-01 LAB — COMPREHENSIVE METABOLIC PANEL
BUN: 9 mg/dL (ref 6–23)
CO2: 23 mEq/L (ref 19–32)
Creatinine, Ser: 0.72 mg/dL (ref 0.40–1.20)
Glucose, Bld: 151 mg/dL — ABNORMAL HIGH (ref 70–99)
Total Bilirubin: 0.3 mg/dL (ref 0.3–1.2)
Total Protein: 6.8 g/dL (ref 6.0–8.3)

## 2007-03-01 LAB — LACTATE DEHYDROGENASE: LDH: 221 U/L (ref 94–250)

## 2007-03-05 LAB — CBC WITH DIFFERENTIAL/PLATELET
BASO%: 1.4 % (ref 0.0–2.0)
EOS%: 9.7 % — ABNORMAL HIGH (ref 0.0–7.0)
LYMPH%: 34.3 % (ref 14.0–48.0)
MCHC: 30.8 g/dL — ABNORMAL LOW (ref 32.0–36.0)
MCV: 76.3 fL — ABNORMAL LOW (ref 81.0–101.0)
MONO%: 18 % — ABNORMAL HIGH (ref 0.0–13.0)
Platelets: 199 10*3/uL (ref 145–400)
RBC: 4.27 10*6/uL (ref 3.70–5.32)

## 2007-03-05 LAB — MORPHOLOGY

## 2007-03-14 LAB — CBC WITH DIFFERENTIAL/PLATELET
BASO%: 2.1 % — ABNORMAL HIGH (ref 0.0–2.0)
HCT: 35.3 % (ref 34.8–46.6)
LYMPH%: 29.4 % (ref 14.0–48.0)
MCH: 23.1 pg — ABNORMAL LOW (ref 26.0–34.0)
MCHC: 30.3 g/dL — ABNORMAL LOW (ref 32.0–36.0)
MONO#: 0.7 10*3/uL (ref 0.1–0.9)
NEUT%: 42.5 % (ref 39.6–76.8)
Platelets: 173 10*3/uL (ref 145–400)
WBC: 4.1 10*3/uL (ref 3.9–10.0)

## 2007-03-14 LAB — MORPHOLOGY: PLT EST: ADEQUATE

## 2007-03-19 LAB — CBC WITH DIFFERENTIAL/PLATELET
Basophils Absolute: 0.1 10*3/uL (ref 0.0–0.1)
EOS%: 8 % — ABNORMAL HIGH (ref 0.0–7.0)
HCT: 34.6 % — ABNORMAL LOW (ref 34.8–46.6)
HGB: 10.5 g/dL — ABNORMAL LOW (ref 11.6–15.9)
MCH: 22.6 pg — ABNORMAL LOW (ref 26.0–34.0)
MONO#: 0.8 10*3/uL (ref 0.1–0.9)
NEUT%: 70.6 % (ref 39.6–76.8)
lymph#: 0.6 10*3/uL — ABNORMAL LOW (ref 0.9–3.3)

## 2007-03-19 LAB — MORPHOLOGY: PLT EST: ADEQUATE

## 2007-03-26 LAB — CBC WITH DIFFERENTIAL/PLATELET
BASO%: 0.2 % (ref 0.0–2.0)
Eosinophils Absolute: 0.4 10*3/uL (ref 0.0–0.5)
HCT: 33.5 % — ABNORMAL LOW (ref 34.8–46.6)
MCHC: 31.3 g/dL — ABNORMAL LOW (ref 32.0–36.0)
MONO#: 0.2 10*3/uL (ref 0.1–0.9)
NEUT#: 2.8 10*3/uL (ref 1.5–6.5)
RBC: 4.59 10*6/uL (ref 3.70–5.32)
WBC: 3.7 10*3/uL — ABNORMAL LOW (ref 3.9–10.0)
lymph#: 0.3 10*3/uL — ABNORMAL LOW (ref 0.9–3.3)

## 2007-03-26 LAB — MORPHOLOGY: PLT EST: ADEQUATE

## 2007-04-01 ENCOUNTER — Ambulatory Visit (HOSPITAL_COMMUNITY): Admission: RE | Admit: 2007-04-01 | Discharge: 2007-04-01 | Payer: Self-pay | Admitting: Oncology

## 2007-04-01 LAB — CULTURE, BLOOD (SINGLE)

## 2007-04-03 ENCOUNTER — Ambulatory Visit: Payer: Self-pay | Admitting: Oncology

## 2007-04-05 LAB — COMPREHENSIVE METABOLIC PANEL
AST: 15 U/L (ref 0–37)
Albumin: 4 g/dL (ref 3.5–5.2)
BUN: 8 mg/dL (ref 6–23)
Calcium: 10 mg/dL (ref 8.4–10.5)
Chloride: 105 mEq/L (ref 96–112)
Potassium: 3.5 mEq/L (ref 3.5–5.3)
Sodium: 140 mEq/L (ref 135–145)
Total Protein: 6.8 g/dL (ref 6.0–8.3)

## 2007-04-05 LAB — CBC WITH DIFFERENTIAL/PLATELET
Basophils Absolute: 0 10*3/uL (ref 0.0–0.1)
Eosinophils Absolute: 0.4 10*3/uL (ref 0.0–0.5)
HGB: 10.3 g/dL — ABNORMAL LOW (ref 11.6–15.9)
LYMPH%: 15.4 % (ref 14.0–48.0)
MCV: 72.9 fL — ABNORMAL LOW (ref 81.0–101.0)
MONO#: 0.2 10*3/uL (ref 0.1–0.9)
MONO%: 6.7 % (ref 0.0–13.0)
NEUT#: 1.7 10*3/uL (ref 1.5–6.5)
Platelets: 255 10*3/uL (ref 145–400)

## 2007-04-05 LAB — MORPHOLOGY

## 2007-04-12 ENCOUNTER — Ambulatory Visit (HOSPITAL_COMMUNITY): Admission: RE | Admit: 2007-04-12 | Discharge: 2007-04-12 | Payer: Self-pay | Admitting: Oncology

## 2007-04-30 LAB — CBC WITH DIFFERENTIAL/PLATELET
MCHC: 32.4 g/dL (ref 32.0–36.0)
MCV: 72 fL — ABNORMAL LOW (ref 81.0–101.0)
Platelets: ADEQUATE 10*3/uL (ref 145–400)
RBC: 4.37 10*6/uL (ref 3.70–5.32)

## 2007-04-30 LAB — MANUAL DIFFERENTIAL
ALC: 1.5 10*3/uL (ref 0.9–3.3)
Band Neutrophils: 21 % — ABNORMAL HIGH (ref 0–10)
EOS: 10 % — ABNORMAL HIGH (ref 0–7)
LYMPH: 44 % (ref 14–49)
MONO: 7 % (ref 0–14)
Myelocytes: 0 % (ref 0–0)
Other Cell: 0 % (ref 0–0)
SEG: 15 % — ABNORMAL LOW (ref 38–77)
Variant Lymph: 0 % (ref 0–0)
nRBC: 0 % (ref 0–0)

## 2007-04-30 LAB — COMPREHENSIVE METABOLIC PANEL
Alkaline Phosphatase: 268 U/L — ABNORMAL HIGH (ref 39–117)
BUN: 9 mg/dL (ref 6–23)
CO2: 21 mEq/L (ref 19–32)
Glucose, Bld: 204 mg/dL — ABNORMAL HIGH (ref 70–99)
Sodium: 143 mEq/L (ref 135–145)
Total Bilirubin: 0.3 mg/dL (ref 0.3–1.2)
Total Protein: 6.6 g/dL (ref 6.0–8.3)

## 2007-04-30 LAB — LACTATE DEHYDROGENASE: LDH: 313 U/L — ABNORMAL HIGH (ref 94–250)

## 2007-05-15 ENCOUNTER — Ambulatory Visit: Payer: Self-pay | Admitting: Oncology

## 2007-05-28 LAB — MANUAL DIFFERENTIAL
ALC: 0.3 10*3/uL — ABNORMAL LOW (ref 0.9–3.3)
ANC (CHCC manual diff): 0.7 10*3/uL — ABNORMAL LOW (ref 1.5–6.5)
Blasts: 0 % (ref 0–0)
LYMPH: 17 % (ref 14–49)
Metamyelocytes: 0 % (ref 0–0)
Myelocytes: 0 % (ref 0–0)
Other Cell: 0 % (ref 0–0)
SEG: 37 % — ABNORMAL LOW (ref 38–77)
Variant Lymph: 0 % (ref 0–0)

## 2007-05-28 LAB — BASIC METABOLIC PANEL
Calcium: 10 mg/dL (ref 8.4–10.5)
Glucose, Bld: 168 mg/dL — ABNORMAL HIGH (ref 70–99)
Potassium: 3.9 mEq/L (ref 3.5–5.3)
Sodium: 140 mEq/L (ref 135–145)

## 2007-05-28 LAB — CBC WITH DIFFERENTIAL/PLATELET
HCT: 35.2 % (ref 34.8–46.6)
MCHC: 31.2 g/dL — ABNORMAL LOW (ref 32.0–36.0)
Platelets: 179 10*3/uL (ref 145–400)
RBC: 4.89 10*6/uL (ref 3.70–5.32)
WBC: 1.7 10*3/uL — ABNORMAL LOW (ref 3.9–10.0)

## 2007-05-28 LAB — LACTATE DEHYDROGENASE: LDH: 265 U/L — ABNORMAL HIGH (ref 94–250)

## 2007-06-11 LAB — CBC WITH DIFFERENTIAL/PLATELET
Basophils Absolute: 0.1 10*3/uL (ref 0.0–0.1)
Eosinophils Absolute: 0.3 10*3/uL (ref 0.0–0.5)
HGB: 10.5 g/dL — ABNORMAL LOW (ref 11.6–15.9)
LYMPH%: 16.9 % (ref 14.0–48.0)
MCH: 23.4 pg — ABNORMAL LOW (ref 26.0–34.0)
MCV: 70.5 fL — ABNORMAL LOW (ref 81.0–101.0)
MONO%: 9.2 % (ref 0.0–13.0)
NEUT#: 2.8 10*3/uL (ref 1.5–6.5)
NEUT%: 64.3 % (ref 39.6–76.8)
Platelets: 187 10*3/uL (ref 145–400)

## 2007-06-17 LAB — CBC WITH DIFFERENTIAL/PLATELET
Basophils Absolute: 0 10*3/uL (ref 0.0–0.1)
Eosinophils Absolute: 0.2 10*3/uL (ref 0.0–0.5)
HCT: 31.8 % — ABNORMAL LOW (ref 34.8–46.6)
HGB: 10.3 g/dL — ABNORMAL LOW (ref 11.6–15.9)
LYMPH%: 25.1 % (ref 14.0–48.0)
MCV: 71.3 fL — ABNORMAL LOW (ref 81.0–101.0)
MONO%: 6.1 % (ref 0.0–13.0)
NEUT#: 2.3 10*3/uL (ref 1.5–6.5)
NEUT%: 62.2 % (ref 39.6–76.8)
Platelets: 220 10*3/uL (ref 145–400)
RDW: 16 % — ABNORMAL HIGH (ref 11.3–14.5)

## 2007-06-17 LAB — COMPREHENSIVE METABOLIC PANEL
ALT: 9 U/L (ref 0–35)
Alkaline Phosphatase: 628 U/L — ABNORMAL HIGH (ref 39–117)
CO2: 20 mEq/L (ref 19–32)
Potassium: 3.7 mEq/L (ref 3.5–5.3)
Sodium: 144 mEq/L (ref 135–145)
Total Bilirubin: 0.4 mg/dL (ref 0.3–1.2)
Total Protein: 6.5 g/dL (ref 6.0–8.3)

## 2007-06-17 LAB — LACTATE DEHYDROGENASE: LDH: 387 U/L — ABNORMAL HIGH (ref 94–250)

## 2007-06-17 LAB — MORPHOLOGY: PLT EST: ADEQUATE

## 2007-06-25 LAB — CBC WITH DIFFERENTIAL/PLATELET
MCV: 72.9 fL — ABNORMAL LOW (ref 81.0–101.0)
RBC: 4.8 10*6/uL (ref 3.70–5.32)
RDW: 15.4 % — ABNORMAL HIGH (ref 11.3–14.5)
WBC: 1.7 10*3/uL — ABNORMAL LOW (ref 3.9–10.0)

## 2007-06-25 LAB — MANUAL DIFFERENTIAL
EOS: 8 % — ABNORMAL HIGH (ref 0–7)
Myelocytes: 0 % (ref 0–0)
Other Cell: 0 % (ref 0–0)
PLT EST: ADEQUATE
PROMYELO: 0 % (ref 0–0)
SEG: 26 % — ABNORMAL LOW (ref 38–77)

## 2007-07-02 ENCOUNTER — Ambulatory Visit: Payer: Self-pay | Admitting: Oncology

## 2007-07-02 LAB — CBC WITH DIFFERENTIAL/PLATELET
EOS%: 4.7 % (ref 0.0–7.0)
Eosinophils Absolute: 0.3 10*3/uL (ref 0.0–0.5)
LYMPH%: 11.8 % — ABNORMAL LOW (ref 14.0–48.0)
MCH: 22.7 pg — ABNORMAL LOW (ref 26.0–34.0)
MCHC: 31.1 g/dL — ABNORMAL LOW (ref 32.0–36.0)
MCV: 72.8 fL — ABNORMAL LOW (ref 81.0–101.0)
MONO%: 17.6 % — ABNORMAL HIGH (ref 0.0–13.0)
Platelets: 164 10*3/uL (ref 145–400)
RBC: 5.32 10*6/uL (ref 3.70–5.32)
RDW: 15.6 % — ABNORMAL HIGH (ref 11.3–14.5)

## 2007-07-11 ENCOUNTER — Ambulatory Visit (HOSPITAL_COMMUNITY): Admission: RE | Admit: 2007-07-11 | Discharge: 2007-07-11 | Payer: Self-pay | Admitting: Oncology

## 2007-07-22 ENCOUNTER — Ambulatory Visit (HOSPITAL_COMMUNITY): Admission: RE | Admit: 2007-07-22 | Discharge: 2007-07-22 | Payer: Self-pay | Admitting: Oncology

## 2007-07-22 LAB — MORPHOLOGY: PLT EST: ADEQUATE

## 2007-07-22 LAB — CBC WITH DIFFERENTIAL/PLATELET
Basophils Absolute: 0 10*3/uL (ref 0.0–0.1)
Eosinophils Absolute: 0.3 10*3/uL (ref 0.0–0.5)
HGB: 10.5 g/dL — ABNORMAL LOW (ref 11.6–15.9)
MCV: 71.2 fL — ABNORMAL LOW (ref 81.0–101.0)
MONO#: 0.4 10*3/uL (ref 0.1–0.9)
MONO%: 14.3 % — ABNORMAL HIGH (ref 0.0–13.0)
NEUT#: 1.5 10*3/uL (ref 1.5–6.5)
RBC: 4.51 10*6/uL (ref 3.70–5.32)
RDW: 15.9 % — ABNORMAL HIGH (ref 11.3–14.5)
WBC: 3.1 10*3/uL — ABNORMAL LOW (ref 3.9–10.0)

## 2007-07-30 LAB — CBC WITH DIFFERENTIAL/PLATELET
Basophils Absolute: 0 10*3/uL (ref 0.0–0.1)
Eosinophils Absolute: 0.3 10*3/uL (ref 0.0–0.5)
HGB: 10.4 g/dL — ABNORMAL LOW (ref 11.6–15.9)
MONO#: 0.3 10*3/uL (ref 0.1–0.9)
MONO%: 13.2 % — ABNORMAL HIGH (ref 0.0–13.0)
NEUT#: 1 10*3/uL — ABNORMAL LOW (ref 1.5–6.5)
RBC: 4.5 10*6/uL (ref 3.70–5.32)
RDW: 15.1 % — ABNORMAL HIGH (ref 11.3–14.5)
WBC: 2.3 10*3/uL — ABNORMAL LOW (ref 3.9–10.0)
lymph#: 0.7 10*3/uL — ABNORMAL LOW (ref 0.9–3.3)

## 2007-07-30 LAB — MORPHOLOGY: PLT EST: ADEQUATE

## 2007-08-06 LAB — CBC WITH DIFFERENTIAL/PLATELET
HGB: 10.8 g/dL — ABNORMAL LOW (ref 11.6–15.9)
MCV: 71.2 fL — ABNORMAL LOW (ref 81.0–101.0)
Platelets: 188 10*3/uL (ref 145–400)
RBC: 4.62 10*6/uL (ref 3.70–5.32)
RDW: 15.4 % — ABNORMAL HIGH (ref 11.3–14.5)
WBC: 2.9 10*3/uL — ABNORMAL LOW (ref 3.9–10.0)

## 2007-08-06 LAB — MANUAL DIFFERENTIAL
ALC: 1.2 10*3/uL (ref 0.9–3.3)
Band Neutrophils: 15 % — ABNORMAL HIGH (ref 0–10)
Blasts: 0 % (ref 0–0)
EOS: 15 % — ABNORMAL HIGH (ref 0–7)
Myelocytes: 4 % — ABNORMAL HIGH (ref 0–0)
Other Cell: 0 % (ref 0–0)
PLT EST: ADEQUATE
PROMYELO: 0 % (ref 0–0)
SEG: 11 % — ABNORMAL LOW (ref 38–77)
Variant Lymph: 0 % (ref 0–0)
nRBC: 0 % (ref 0–0)

## 2007-08-13 ENCOUNTER — Ambulatory Visit: Payer: Self-pay | Admitting: Oncology

## 2007-08-13 LAB — MORPHOLOGY: PLT EST: ADEQUATE

## 2007-08-13 LAB — CBC WITH DIFFERENTIAL/PLATELET
BASO%: 4.1 % — ABNORMAL HIGH (ref 0.0–2.0)
Basophils Absolute: 0.1 10*3/uL (ref 0.0–0.1)
Eosinophils Absolute: 0.4 10*3/uL (ref 0.0–0.5)
HCT: 35.6 % (ref 34.8–46.6)
HGB: 11.1 g/dL — ABNORMAL LOW (ref 11.6–15.9)
LYMPH%: 31.7 % (ref 14.0–48.0)
MCHC: 31.2 g/dL — ABNORMAL LOW (ref 32.0–36.0)
MONO#: 0.4 10*3/uL (ref 0.1–0.9)
NEUT#: 1.1 10*3/uL — ABNORMAL LOW (ref 1.5–6.5)
NEUT%: 38 % — ABNORMAL LOW (ref 39.6–76.8)
Platelets: 168 10*3/uL (ref 145–400)
WBC: 2.9 10*3/uL — ABNORMAL LOW (ref 3.9–10.0)
lymph#: 0.9 10*3/uL (ref 0.9–3.3)

## 2007-08-19 LAB — CBC WITH DIFFERENTIAL/PLATELET
EOS%: 10.9 % — ABNORMAL HIGH (ref 0.0–7.0)
Eosinophils Absolute: 0.3 10*3/uL (ref 0.0–0.5)
HGB: 11 g/dL — ABNORMAL LOW (ref 11.6–15.9)
MCV: 71.2 fL — ABNORMAL LOW (ref 81.0–101.0)
MONO%: 2.5 % (ref 0.0–13.0)
NEUT#: 1.8 10*3/uL (ref 1.5–6.5)
RBC: 4.75 10*6/uL (ref 3.70–5.32)
RDW: 15.6 % — ABNORMAL HIGH (ref 11.3–14.5)
lymph#: 0.7 10*3/uL — ABNORMAL LOW (ref 0.9–3.3)

## 2007-08-19 LAB — COMPREHENSIVE METABOLIC PANEL
Albumin: 4.2 g/dL (ref 3.5–5.2)
Alkaline Phosphatase: 266 U/L — ABNORMAL HIGH (ref 39–117)
CO2: 20 mEq/L (ref 19–32)
Calcium: 9.7 mg/dL (ref 8.4–10.5)
Chloride: 106 mEq/L (ref 96–112)
Glucose, Bld: 205 mg/dL — ABNORMAL HIGH (ref 70–99)
Potassium: 3.8 mEq/L (ref 3.5–5.3)
Sodium: 141 mEq/L (ref 135–145)
Total Protein: 6.3 g/dL (ref 6.0–8.3)

## 2007-08-19 LAB — LACTATE DEHYDROGENASE: LDH: 282 U/L — ABNORMAL HIGH (ref 94–250)

## 2007-09-03 LAB — CBC WITH DIFFERENTIAL/PLATELET
Eosinophils Absolute: 0.3 10*3/uL (ref 0.0–0.5)
HCT: 35.2 % (ref 34.8–46.6)
LYMPH%: 25.4 % (ref 14.0–48.0)
MCV: 72.8 fL — ABNORMAL LOW (ref 81.0–101.0)
MONO#: 0.5 10*3/uL (ref 0.1–0.9)
MONO%: 16 % — ABNORMAL HIGH (ref 0.0–13.0)
NEUT#: 1.4 10*3/uL — ABNORMAL LOW (ref 1.5–6.5)
NEUT%: 44.9 % (ref 39.6–76.8)
Platelets: 169 10*3/uL (ref 145–400)
WBC: 3 10*3/uL — ABNORMAL LOW (ref 3.9–10.0)

## 2007-09-17 LAB — CBC WITH DIFFERENTIAL/PLATELET
Basophils Absolute: 0 10*3/uL (ref 0.0–0.1)
Eosinophils Absolute: 0.3 10*3/uL (ref 0.0–0.5)
HCT: 34.4 % — ABNORMAL LOW (ref 34.8–46.6)
HGB: 11.1 g/dL — ABNORMAL LOW (ref 11.6–15.9)
LYMPH%: 27.3 % (ref 14.0–48.0)
MONO#: 0.3 10*3/uL (ref 0.1–0.9)
NEUT#: 1.7 10*3/uL (ref 1.5–6.5)
NEUT%: 53.6 % (ref 39.6–76.8)
Platelets: 194 10*3/uL (ref 145–400)
WBC: 3.1 10*3/uL — ABNORMAL LOW (ref 3.9–10.0)

## 2007-09-17 LAB — COMPREHENSIVE METABOLIC PANEL
AST: 14 U/L (ref 0–37)
Albumin: 4.4 g/dL (ref 3.5–5.2)
Alkaline Phosphatase: 338 U/L — ABNORMAL HIGH (ref 39–117)
BUN: 12 mg/dL (ref 6–23)
Calcium: 10.1 mg/dL (ref 8.4–10.5)
Chloride: 107 mEq/L (ref 96–112)
Creatinine, Ser: 0.71 mg/dL (ref 0.40–1.20)
Glucose, Bld: 133 mg/dL — ABNORMAL HIGH (ref 70–99)

## 2007-09-17 LAB — MORPHOLOGY: PLT EST: ADEQUATE

## 2007-09-24 ENCOUNTER — Ambulatory Visit (HOSPITAL_COMMUNITY): Admission: RE | Admit: 2007-09-24 | Discharge: 2007-09-24 | Payer: Self-pay | Admitting: Oncology

## 2007-10-01 ENCOUNTER — Ambulatory Visit: Payer: Self-pay | Admitting: Oncology

## 2007-10-11 ENCOUNTER — Encounter (INDEPENDENT_AMBULATORY_CARE_PROVIDER_SITE_OTHER): Payer: Self-pay | Admitting: Otolaryngology

## 2007-10-11 ENCOUNTER — Ambulatory Visit (HOSPITAL_COMMUNITY): Admission: RE | Admit: 2007-10-11 | Discharge: 2007-10-12 | Payer: Self-pay | Admitting: Otolaryngology

## 2007-10-24 LAB — CBC WITH DIFFERENTIAL/PLATELET
BASO%: 0.4 % (ref 0.0–2.0)
Basophils Absolute: 0 10*3/uL (ref 0.0–0.1)
HCT: 28.4 % — ABNORMAL LOW (ref 34.8–46.6)
HGB: 9 g/dL — ABNORMAL LOW (ref 11.6–15.9)
MONO#: 0.3 10*3/uL (ref 0.1–0.9)
NEUT%: 52.7 % (ref 39.6–76.8)
RDW: 15.2 % — ABNORMAL HIGH (ref 11.3–14.5)
WBC: 3.1 10*3/uL — ABNORMAL LOW (ref 3.9–10.0)
lymph#: 0.9 10*3/uL (ref 0.9–3.3)

## 2007-10-24 LAB — TECHNOLOGIST REVIEW

## 2007-11-12 LAB — CBC WITH DIFFERENTIAL/PLATELET
Basophils Absolute: 0.1 10*3/uL (ref 0.0–0.1)
Eosinophils Absolute: 0.2 10*3/uL (ref 0.0–0.5)
HCT: 32.4 % — ABNORMAL LOW (ref 34.8–46.6)
HGB: 9.9 g/dL — ABNORMAL LOW (ref 11.6–15.9)
MCH: 21.5 pg — ABNORMAL LOW (ref 26.0–34.0)
MCV: 70.4 fL — ABNORMAL LOW (ref 81.0–101.0)
MONO%: 18.2 % — ABNORMAL HIGH (ref 0.0–13.0)
NEUT#: 1.1 10*3/uL — ABNORMAL LOW (ref 1.5–6.5)
NEUT%: 36.6 % — ABNORMAL LOW (ref 39.6–76.8)
RDW: 14.9 % — ABNORMAL HIGH (ref 11.3–14.5)
lymph#: 1.1 10*3/uL (ref 0.9–3.3)

## 2007-11-18 ENCOUNTER — Ambulatory Visit: Payer: Self-pay | Admitting: Oncology

## 2007-12-13 ENCOUNTER — Observation Stay (HOSPITAL_COMMUNITY): Admission: EM | Admit: 2007-12-13 | Discharge: 2007-12-14 | Payer: Self-pay | Admitting: Emergency Medicine

## 2008-01-09 ENCOUNTER — Ambulatory Visit: Payer: Self-pay | Admitting: Oncology

## 2008-01-13 LAB — CBC WITH DIFFERENTIAL/PLATELET
Basophils Absolute: 0 10*3/uL (ref 0.0–0.1)
Eosinophils Absolute: 0.2 10*3/uL (ref 0.0–0.5)
HGB: 10.7 g/dL — ABNORMAL LOW (ref 11.6–15.9)
MCV: 69.4 fL — ABNORMAL LOW (ref 81.0–101.0)
MONO#: 0.4 10*3/uL (ref 0.1–0.9)
MONO%: 8.7 % (ref 0.0–13.0)
NEUT#: 1.7 10*3/uL (ref 1.5–6.5)
RDW: 17.5 % — ABNORMAL HIGH (ref 11.3–14.5)
WBC: 4.4 10*3/uL (ref 3.9–10.0)

## 2008-01-13 LAB — LACTATE DEHYDROGENASE: LDH: 313 U/L — ABNORMAL HIGH (ref 94–250)

## 2008-01-13 LAB — COMPREHENSIVE METABOLIC PANEL
BUN: 11 mg/dL (ref 6–23)
CO2: 24 mEq/L (ref 19–32)
Calcium: 10 mg/dL (ref 8.4–10.5)
Chloride: 105 mEq/L (ref 96–112)
Creatinine, Ser: 0.77 mg/dL (ref 0.40–1.20)
Glucose, Bld: 135 mg/dL — ABNORMAL HIGH (ref 70–99)
Total Bilirubin: 0.4 mg/dL (ref 0.3–1.2)

## 2008-01-13 LAB — MORPHOLOGY: PLT EST: ADEQUATE

## 2008-03-04 ENCOUNTER — Ambulatory Visit: Payer: Self-pay | Admitting: Oncology

## 2008-03-24 LAB — CBC WITH DIFFERENTIAL/PLATELET
BASO%: 1.2 % (ref 0.0–2.0)
Basophils Absolute: 0.1 10*3/uL (ref 0.0–0.1)
EOS%: 2.9 % (ref 0.0–7.0)
HCT: 33.3 % — ABNORMAL LOW (ref 34.8–46.6)
LYMPH%: 74.6 % — ABNORMAL HIGH (ref 14.0–48.0)
MCH: 22.6 pg — ABNORMAL LOW (ref 26.0–34.0)
MCHC: 31.5 g/dL — ABNORMAL LOW (ref 32.0–36.0)
MONO#: 1.1 10*3/uL — ABNORMAL HIGH (ref 0.1–0.9)
NEUT%: 7 % — ABNORMAL LOW (ref 39.6–76.8)
Platelets: 194 10*3/uL (ref 145–400)

## 2008-03-24 LAB — MORPHOLOGY

## 2008-05-15 ENCOUNTER — Ambulatory Visit: Payer: Self-pay | Admitting: Oncology

## 2008-05-20 LAB — MANUAL DIFFERENTIAL
ALC: 8.2 10*3/uL — ABNORMAL HIGH (ref 0.9–3.3)
ANC (CHCC manual diff): 0.5 10*3/uL — ABNORMAL LOW (ref 1.5–6.5)
Band Neutrophils: 0 % (ref 0–10)
Blasts: 0 % (ref 0–0)
Other Cell: 0 % (ref 0–0)
PROMYELO: 0 % (ref 0–0)
SEG: 5 % — ABNORMAL LOW (ref 38–77)
nRBC: 0 % (ref 0–0)

## 2008-05-20 LAB — CBC WITH DIFFERENTIAL/PLATELET
HCT: 36.3 % (ref 34.8–46.6)
HGB: 11.1 g/dL — ABNORMAL LOW (ref 11.6–15.9)
MCH: 21.8 pg — ABNORMAL LOW (ref 25.1–34.0)
MCV: 71.2 fL — ABNORMAL LOW (ref 79.5–101.0)
Platelets: 170 10*3/uL (ref 145–400)
RDW: 15.7 % — ABNORMAL HIGH (ref 11.2–14.5)
nRBC: 0 % (ref 0–0)

## 2008-05-20 LAB — COMPREHENSIVE METABOLIC PANEL
ALT: 13 U/L (ref 0–35)
Alkaline Phosphatase: 188 U/L — ABNORMAL HIGH (ref 39–117)
Sodium: 143 mEq/L (ref 135–145)
Total Bilirubin: 0.5 mg/dL (ref 0.3–1.2)
Total Protein: 5.9 g/dL — ABNORMAL LOW (ref 6.0–8.3)

## 2008-06-17 LAB — MANUAL DIFFERENTIAL
ANC (CHCC manual diff): 0.4 10*3/uL — CL (ref 1.5–6.5)
Basophil: 0 % (ref 0–2)
Blasts: 0 % (ref 0–0)
LYMPH: 96 % — ABNORMAL HIGH (ref 14–49)
Metamyelocytes: 0 % (ref 0–0)
PROMYELO: 0 % (ref 0–0)

## 2008-06-17 LAB — COMPREHENSIVE METABOLIC PANEL
Alkaline Phosphatase: 152 U/L — ABNORMAL HIGH (ref 39–117)
Glucose, Bld: 124 mg/dL — ABNORMAL HIGH (ref 70–99)
Sodium: 145 mEq/L (ref 135–145)
Total Bilirubin: 0.4 mg/dL (ref 0.3–1.2)
Total Protein: 5.9 g/dL — ABNORMAL LOW (ref 6.0–8.3)

## 2008-06-17 LAB — CBC WITH DIFFERENTIAL/PLATELET
HCT: 33.2 % — ABNORMAL LOW (ref 34.8–46.6)
HGB: 10.4 g/dL — ABNORMAL LOW (ref 11.6–15.9)
LYMPH%: 52.1 % — ABNORMAL HIGH (ref 14.0–49.7)
MCHC: 31.5 g/dL (ref 31.5–36.0)
NEUT%: 4.7 % — ABNORMAL LOW (ref 38.4–76.8)
RDW: 16.9 % — ABNORMAL HIGH (ref 11.2–14.5)

## 2008-06-20 DEATH — deceased

## 2008-07-13 ENCOUNTER — Ambulatory Visit: Payer: Self-pay | Admitting: Oncology

## 2008-07-15 LAB — MANUAL DIFFERENTIAL
ALC: 35.9 10e3/uL — ABNORMAL HIGH (ref 0.9–3.3)
ANC (CHCC manual diff): 1.1 10e3/uL — ABNORMAL LOW (ref 1.5–6.5)
Band Neutrophils: 0 % (ref 0–10)
Basophil: 0 % (ref 0–2)
Blasts: 0 % (ref 0–0)
EOS: 0 % (ref 0–7)
LYMPH: 94 % — ABNORMAL HIGH (ref 14–49)
MONO: 3 % (ref 0–14)
Metamyelocytes: 0 % (ref 0–0)
Myelocytes: 0 % (ref 0–0)
Other Cell: 0 % (ref 0–0)
PLT EST: ADEQUATE
PROMYELO: 0 % (ref 0–0)
SEG: 3 % — ABNORMAL LOW (ref 38–77)
Variant Lymph: 0 % (ref 0–0)
nRBC: 0 % (ref 0–0)

## 2008-07-15 LAB — CBC WITH DIFFERENTIAL/PLATELET
HCT: 33.2 % — ABNORMAL LOW (ref 34.8–46.6)
HGB: 10.1 g/dL — ABNORMAL LOW (ref 11.6–15.9)
MCH: 22.8 pg — ABNORMAL LOW (ref 25.1–34.0)
MCHC: 30.4 g/dL — ABNORMAL LOW (ref 31.5–36.0)
MCV: 74.9 fL — ABNORMAL LOW (ref 79.5–101.0)
Platelets: 144 10e3/uL — ABNORMAL LOW (ref 145–400)
RBC: 4.43 10e6/uL (ref 3.70–5.45)
RDW: 16.4 % — ABNORMAL HIGH (ref 11.2–14.5)
WBC: 38.2 10e3/uL — ABNORMAL HIGH (ref 3.9–10.3)

## 2008-07-15 LAB — COMPREHENSIVE METABOLIC PANEL WITH GFR
ALT: 9 U/L (ref 0–35)
AST: 15 U/L (ref 0–37)
Albumin: 4.1 g/dL (ref 3.5–5.2)
Alkaline Phosphatase: 144 U/L — ABNORMAL HIGH (ref 39–117)
BUN: 13 mg/dL (ref 6–23)
CO2: 23 meq/L (ref 19–32)
Calcium: 9.2 mg/dL (ref 8.4–10.5)
Chloride: 111 meq/L (ref 96–112)
Creatinine, Ser: 1 mg/dL (ref 0.40–1.20)
Glucose, Bld: 136 mg/dL — ABNORMAL HIGH (ref 70–99)
Potassium: 3.5 meq/L (ref 3.5–5.3)
Sodium: 145 meq/L (ref 135–145)
Total Bilirubin: 0.4 mg/dL (ref 0.3–1.2)
Total Protein: 6 g/dL (ref 6.0–8.3)

## 2008-07-15 LAB — LACTATE DEHYDROGENASE: LDH: 489 U/L — ABNORMAL HIGH (ref 94–250)

## 2008-07-28 LAB — MANUAL DIFFERENTIAL
ANC (CHCC manual diff): 0.5 10*3/uL — ABNORMAL LOW (ref 1.5–6.5)
Basophil: 0 % (ref 0–2)
Blasts: 0 % (ref 0–0)
EOS: 0 % (ref 0–7)
LYMPH: 95 % — ABNORMAL HIGH (ref 14–49)
MONO: 4 % (ref 0–14)
Metamyelocytes: 0 % (ref 0–0)
PLT EST: DECREASED

## 2008-07-28 LAB — CBC WITH DIFFERENTIAL/PLATELET
MCHC: 30.6 g/dL — ABNORMAL LOW (ref 31.5–36.0)
Platelets: 133 10*3/uL — ABNORMAL LOW (ref 145–400)
RBC: 4.27 10*6/uL (ref 3.70–5.45)

## 2008-08-04 LAB — MANUAL DIFFERENTIAL
ANC (CHCC manual diff): 0.2 10*3/uL — CL (ref 1.5–6.5)
Basophil: 0 % (ref 0–2)
Blasts: 0 % (ref 0–0)
Metamyelocytes: 0 % (ref 0–0)
PLT EST: ADEQUATE
PROMYELO: 0 % (ref 0–0)

## 2008-08-04 LAB — CBC WITH DIFFERENTIAL/PLATELET
MCHC: 30.7 g/dL — ABNORMAL LOW (ref 31.5–36.0)
RBC: 4.37 10*6/uL (ref 3.70–5.45)
RDW: 16.5 % — ABNORMAL HIGH (ref 11.2–14.5)
nRBC: 0 % (ref 0–0)

## 2008-08-10 LAB — CBC WITH DIFFERENTIAL/PLATELET
BASO%: 0.1 % (ref 0.0–2.0)
HCT: 33 % — ABNORMAL LOW (ref 34.8–46.6)
HGB: 10.9 g/dL — ABNORMAL LOW (ref 11.6–15.9)
MCHC: 33 g/dL (ref 31.5–36.0)
MONO#: 0.5 10*3/uL (ref 0.1–0.9)
NEUT%: 5.2 % — ABNORMAL LOW (ref 38.4–76.8)
WBC: 7.4 10*3/uL (ref 3.9–10.3)
lymph#: 6.5 10*3/uL — ABNORMAL HIGH (ref 0.9–3.3)

## 2008-08-10 LAB — MORPHOLOGY

## 2008-08-10 LAB — COMPREHENSIVE METABOLIC PANEL
AST: 10 U/L (ref 0–37)
Albumin: 4.2 g/dL (ref 3.5–5.2)
Alkaline Phosphatase: 195 U/L — ABNORMAL HIGH (ref 39–117)
BUN: 13 mg/dL (ref 6–23)
Creatinine, Ser: 0.73 mg/dL (ref 0.40–1.20)
Potassium: 4 mEq/L (ref 3.5–5.3)
Total Bilirubin: 0.4 mg/dL (ref 0.3–1.2)

## 2008-08-18 ENCOUNTER — Ambulatory Visit: Payer: Self-pay | Admitting: Oncology

## 2008-08-18 LAB — CBC WITH DIFFERENTIAL/PLATELET
Basophils Absolute: 0 10*3/uL (ref 0.0–0.1)
Eosinophils Absolute: 0.1 10*3/uL (ref 0.0–0.5)
HCT: 33.2 % — ABNORMAL LOW (ref 34.8–46.6)
HGB: 10.3 g/dL — ABNORMAL LOW (ref 11.6–15.9)
LYMPH%: 74.5 % — ABNORMAL HIGH (ref 14.0–49.7)
MCV: 73.8 fL — ABNORMAL LOW (ref 79.5–101.0)
MONO#: 1 10*3/uL — ABNORMAL HIGH (ref 0.1–0.9)
MONO%: 14.7 % — ABNORMAL HIGH (ref 0.0–14.0)
NEUT#: 0.6 10*3/uL — ABNORMAL LOW (ref 1.5–6.5)
NEUT%: 9.3 % — ABNORMAL LOW (ref 38.4–76.8)
Platelets: 153 10*3/uL (ref 145–400)

## 2008-08-18 LAB — TECHNOLOGIST REVIEW

## 2008-08-25 ENCOUNTER — Other Ambulatory Visit: Payer: Self-pay | Admitting: Oncology

## 2008-08-25 LAB — CBC WITH DIFFERENTIAL/PLATELET
BASO%: 0.5 % (ref 0.0–2.0)
LYMPH%: 77.2 % — ABNORMAL HIGH (ref 14.0–49.7)
MCHC: 30.6 g/dL — ABNORMAL LOW (ref 31.5–36.0)
MCV: 74.5 fL — ABNORMAL LOW (ref 79.5–101.0)
MONO%: 9.3 % (ref 0.0–14.0)
Platelets: 167 10*3/uL (ref 145–400)
RBC: 4.39 10*6/uL (ref 3.70–5.45)
RDW: 16 % — ABNORMAL HIGH (ref 11.2–14.5)
WBC: 6.2 10*3/uL (ref 3.9–10.3)

## 2008-09-01 LAB — COMPREHENSIVE METABOLIC PANEL
ALT: 12 U/L (ref 0–35)
CO2: 20 mEq/L (ref 19–32)
Chloride: 108 mEq/L (ref 96–112)
Sodium: 142 mEq/L (ref 135–145)
Total Bilirubin: 0.3 mg/dL (ref 0.3–1.2)
Total Protein: 6.1 g/dL (ref 6.0–8.3)

## 2008-09-01 LAB — MORPHOLOGY: PLT EST: ADEQUATE

## 2008-09-01 LAB — CBC WITH DIFFERENTIAL/PLATELET
Basophils Absolute: 0 10*3/uL (ref 0.0–0.1)
EOS%: 0.7 % (ref 0.0–7.0)
HCT: 34.2 % — ABNORMAL LOW (ref 34.8–46.6)
HGB: 10.5 g/dL — ABNORMAL LOW (ref 11.6–15.9)
LYMPH%: 71 % — ABNORMAL HIGH (ref 14.0–49.7)
MCH: 22.8 pg — ABNORMAL LOW (ref 25.1–34.0)
MCV: 74.3 fL — ABNORMAL LOW (ref 79.5–101.0)
MONO%: 17.5 % — ABNORMAL HIGH (ref 0.0–14.0)
NEUT%: 10.4 % — ABNORMAL LOW (ref 38.4–76.8)
Platelets: 174 10*3/uL (ref 145–400)

## 2008-09-01 LAB — LACTATE DEHYDROGENASE: LDH: 304 U/L — ABNORMAL HIGH (ref 94–250)

## 2008-09-11 ENCOUNTER — Ambulatory Visit: Payer: Self-pay | Admitting: Oncology

## 2008-09-16 LAB — COMPREHENSIVE METABOLIC PANEL
ALT: 10 U/L (ref 0–35)
CO2: 21 mEq/L (ref 19–32)
Chloride: 106 mEq/L (ref 96–112)
Sodium: 140 mEq/L (ref 135–145)
Total Bilirubin: 0.4 mg/dL (ref 0.3–1.2)
Total Protein: 5.7 g/dL — ABNORMAL LOW (ref 6.0–8.3)

## 2008-09-16 LAB — CBC WITH DIFFERENTIAL/PLATELET
BASO%: 0.8 % (ref 0.0–2.0)
EOS%: 2.5 % (ref 0.0–7.0)
HCT: 33.5 % — ABNORMAL LOW (ref 34.8–46.6)
LYMPH%: 81.6 % — ABNORMAL HIGH (ref 14.0–49.7)
MCH: 23.2 pg — ABNORMAL LOW (ref 25.1–34.0)
MCHC: 31 g/dL — ABNORMAL LOW (ref 31.5–36.0)
MONO%: 7.2 % (ref 0.0–14.0)
NEUT%: 7.9 % — ABNORMAL LOW (ref 38.4–76.8)
Platelets: 149 10*3/uL (ref 145–400)
lymph#: 4.3 10*3/uL — ABNORMAL HIGH (ref 0.9–3.3)

## 2008-09-16 LAB — LACTATE DEHYDROGENASE: LDH: 289 U/L — ABNORMAL HIGH (ref 94–250)

## 2008-10-07 ENCOUNTER — Encounter (INDEPENDENT_AMBULATORY_CARE_PROVIDER_SITE_OTHER): Payer: Self-pay | Admitting: Internal Medicine

## 2008-10-07 ENCOUNTER — Ambulatory Visit: Admission: RE | Admit: 2008-10-07 | Discharge: 2008-10-07 | Payer: Self-pay | Admitting: Internal Medicine

## 2008-10-07 ENCOUNTER — Ambulatory Visit: Payer: Self-pay | Admitting: Vascular Surgery

## 2008-10-12 ENCOUNTER — Ambulatory Visit: Payer: Self-pay | Admitting: Oncology

## 2008-10-14 LAB — COMPREHENSIVE METABOLIC PANEL
ALT: 11 U/L (ref 0–35)
AST: 14 U/L (ref 0–37)
Albumin: 4.2 g/dL (ref 3.5–5.2)
BUN: 16 mg/dL (ref 6–23)
CO2: 21 mEq/L (ref 19–32)
Calcium: 9.6 mg/dL (ref 8.4–10.5)
Chloride: 108 mEq/L (ref 96–112)
Potassium: 4 mEq/L (ref 3.5–5.3)

## 2008-10-14 LAB — MANUAL DIFFERENTIAL
ALC: 5.6 10*3/uL — ABNORMAL HIGH (ref 0.9–3.3)
ANC (CHCC manual diff): 0.4 10*3/uL — CL (ref 1.5–6.5)
Band Neutrophils: 5 % (ref 0–10)
Basophil: 0 % (ref 0–2)
Blasts: 0 % (ref 0–0)
LYMPH: 88 % — ABNORMAL HIGH (ref 14–49)
PLT EST: ADEQUATE
PROMYELO: 0 % (ref 0–0)
SEG: 1 % — ABNORMAL LOW (ref 38–77)
Variant Lymph: 0 % (ref 0–0)
nRBC: 0 % (ref 0–0)

## 2008-10-14 LAB — CBC WITH DIFFERENTIAL/PLATELET
HCT: 33.7 % — ABNORMAL LOW (ref 34.8–46.6)
HGB: 10.6 g/dL — ABNORMAL LOW (ref 11.6–15.9)
MCH: 23.5 pg — ABNORMAL LOW (ref 25.1–34.0)
RDW: 14.4 % (ref 11.2–14.5)
WBC: 6.4 10*3/uL (ref 3.9–10.3)

## 2008-10-14 LAB — PROTHROMBIN TIME: Prothrombin Time: 16.9 seconds — ABNORMAL HIGH (ref 11.6–15.2)

## 2008-11-12 ENCOUNTER — Ambulatory Visit: Payer: Self-pay | Admitting: Oncology

## 2008-11-12 LAB — COMPREHENSIVE METABOLIC PANEL
ALT: 18 U/L (ref 0–35)
CO2: 23 mEq/L (ref 19–32)
Creatinine, Ser: 0.8 mg/dL (ref 0.40–1.20)
Total Bilirubin: 0.4 mg/dL (ref 0.3–1.2)

## 2008-11-12 LAB — CBC WITH DIFFERENTIAL/PLATELET
Basophils Absolute: 0 10*3/uL (ref 0.0–0.1)
Eosinophils Absolute: 0.1 10*3/uL (ref 0.0–0.5)
HCT: 36 % (ref 34.8–46.6)
HGB: 10.9 g/dL — ABNORMAL LOW (ref 11.6–15.9)
MONO#: 0.6 10*3/uL (ref 0.1–0.9)
NEUT#: 0.5 10*3/uL — ABNORMAL LOW (ref 1.5–6.5)
NEUT%: 9.6 % — ABNORMAL LOW (ref 38.4–76.8)
WBC: 4.8 10*3/uL (ref 3.9–10.3)
lymph#: 3.6 10*3/uL — ABNORMAL HIGH (ref 0.9–3.3)

## 2008-11-12 LAB — LACTATE DEHYDROGENASE: LDH: 240 U/L (ref 94–250)

## 2008-11-18 ENCOUNTER — Ambulatory Visit (HOSPITAL_COMMUNITY): Admission: RE | Admit: 2008-11-18 | Discharge: 2008-11-18 | Payer: Self-pay | Admitting: Oncology

## 2008-11-24 LAB — CBC WITH DIFFERENTIAL/PLATELET
EOS%: 2.4 % (ref 0.0–7.0)
MCH: 22.5 pg — ABNORMAL LOW (ref 25.1–34.0)
MCHC: 30.1 g/dL — ABNORMAL LOW (ref 31.5–36.0)
MCV: 74.8 fL — ABNORMAL LOW (ref 79.5–101.0)
MONO%: 12.3 % (ref 0.0–14.0)
RBC: 5.07 10*6/uL (ref 3.70–5.45)
RDW: 14.6 % — ABNORMAL HIGH (ref 11.2–14.5)

## 2008-11-24 LAB — PROTIME-INR: Protime: 15.6 Seconds — ABNORMAL HIGH (ref 10.6–13.4)

## 2008-11-24 LAB — MORPHOLOGY: PLT EST: ADEQUATE

## 2008-11-30 LAB — PROTIME-INR: INR: 1.9 — ABNORMAL LOW (ref 2.00–3.50)

## 2008-12-10 LAB — COMPREHENSIVE METABOLIC PANEL
CO2: 25 mEq/L (ref 19–32)
Creatinine, Ser: 0.79 mg/dL (ref 0.40–1.20)
Glucose, Bld: 162 mg/dL — ABNORMAL HIGH (ref 70–99)
Sodium: 143 mEq/L (ref 135–145)
Total Bilirubin: 0.3 mg/dL (ref 0.3–1.2)
Total Protein: 6 g/dL (ref 6.0–8.3)

## 2008-12-10 LAB — MORPHOLOGY: PLT EST: ADEQUATE

## 2008-12-10 LAB — CBC WITH DIFFERENTIAL/PLATELET
Basophils Absolute: 0 10*3/uL (ref 0.0–0.1)
Eosinophils Absolute: 0.1 10*3/uL (ref 0.0–0.5)
HGB: 11.3 g/dL — ABNORMAL LOW (ref 11.6–15.9)
NEUT#: 0.6 10*3/uL — ABNORMAL LOW (ref 1.5–6.5)
RDW: 15.3 % — ABNORMAL HIGH (ref 11.2–14.5)
lymph#: 3.2 10*3/uL (ref 0.9–3.3)

## 2008-12-10 LAB — LACTATE DEHYDROGENASE: LDH: 279 U/L — ABNORMAL HIGH (ref 94–250)

## 2008-12-22 ENCOUNTER — Ambulatory Visit: Payer: Self-pay | Admitting: Oncology

## 2008-12-24 LAB — CBC WITH DIFFERENTIAL/PLATELET
BASO%: 1.8 % (ref 0.0–2.0)
Basophils Absolute: 0.1 10*3/uL (ref 0.0–0.1)
EOS%: 2 % (ref 0.0–7.0)
HCT: 34.8 % (ref 34.8–46.6)
HGB: 11.3 g/dL — ABNORMAL LOW (ref 11.6–15.9)
LYMPH%: 74.2 % — ABNORMAL HIGH (ref 14.0–49.7)
MCH: 23.8 pg — ABNORMAL LOW (ref 25.1–34.0)
MCHC: 32.6 g/dL (ref 31.5–36.0)
MCV: 73.2 fL — ABNORMAL LOW (ref 79.5–101.0)
MONO%: 9.7 % (ref 0.0–14.0)
NEUT%: 12.3 % — ABNORMAL LOW (ref 38.4–76.8)
Platelets: 169 10*3/uL (ref 145–400)

## 2008-12-24 LAB — COMPREHENSIVE METABOLIC PANEL
ALT: 10 U/L (ref 0–35)
Albumin: 4.2 g/dL (ref 3.5–5.2)
CO2: 23 mEq/L (ref 19–32)
Chloride: 107 mEq/L (ref 96–112)
Glucose, Bld: 107 mg/dL — ABNORMAL HIGH (ref 70–99)
Potassium: 3.9 mEq/L (ref 3.5–5.3)
Sodium: 142 mEq/L (ref 135–145)
Total Protein: 6.2 g/dL (ref 6.0–8.3)

## 2008-12-24 LAB — URIC ACID: Uric Acid, Serum: 6.4 mg/dL (ref 2.4–7.0)

## 2008-12-24 LAB — LACTATE DEHYDROGENASE: LDH: 386 U/L — ABNORMAL HIGH (ref 94–250)

## 2008-12-24 LAB — MORPHOLOGY: PLT EST: ADEQUATE

## 2009-01-07 LAB — MORPHOLOGY: PLT EST: ADEQUATE

## 2009-01-07 LAB — CBC WITH DIFFERENTIAL/PLATELET
BASO%: 0.8 % (ref 0.0–2.0)
Basophils Absolute: 0.1 10*3/uL (ref 0.0–0.1)
EOS%: 2.2 % (ref 0.0–7.0)
MCH: 22.6 pg — ABNORMAL LOW (ref 25.1–34.0)
MCHC: 30.3 g/dL — ABNORMAL LOW (ref 31.5–36.0)
MCV: 74.7 fL — ABNORMAL LOW (ref 79.5–101.0)
MONO%: 16.7 % — ABNORMAL HIGH (ref 0.0–14.0)
NEUT%: 11.6 % — ABNORMAL LOW (ref 38.4–76.8)
RDW: 14.9 % — ABNORMAL HIGH (ref 11.2–14.5)
lymph#: 5.1 10*3/uL — ABNORMAL HIGH (ref 0.9–3.3)

## 2009-01-19 LAB — MORPHOLOGY: PLT EST: ADEQUATE

## 2009-01-19 LAB — CBC WITH DIFFERENTIAL/PLATELET
Basophils Absolute: 0 10*3/uL (ref 0.0–0.1)
Eosinophils Absolute: 0.1 10*3/uL (ref 0.0–0.5)
HCT: 35.7 % (ref 34.8–46.6)
HGB: 11.1 g/dL — ABNORMAL LOW (ref 11.6–15.9)
LYMPH%: 67.3 % — ABNORMAL HIGH (ref 14.0–49.7)
MCHC: 31.1 g/dL — ABNORMAL LOW (ref 31.5–36.0)
MONO#: 0.3 10*3/uL (ref 0.1–0.9)
NEUT%: 22.9 % — ABNORMAL LOW (ref 38.4–76.8)
Platelets: 182 10*3/uL (ref 145–400)
WBC: 4 10*3/uL (ref 3.9–10.3)
lymph#: 2.7 10*3/uL (ref 0.9–3.3)

## 2009-01-19 LAB — COMPREHENSIVE METABOLIC PANEL
ALT: 10 U/L (ref 0–35)
AST: 10 U/L (ref 0–37)
Albumin: 4.2 g/dL (ref 3.5–5.2)
BUN: 13 mg/dL (ref 6–23)
Calcium: 9.9 mg/dL (ref 8.4–10.5)
Chloride: 107 mEq/L (ref 96–112)
Potassium: 3.8 mEq/L (ref 3.5–5.3)

## 2009-01-24 ENCOUNTER — Encounter: Payer: Self-pay | Admitting: Cardiovascular Disease

## 2009-01-24 ENCOUNTER — Ambulatory Visit: Payer: Self-pay | Admitting: Cardiology

## 2009-01-24 ENCOUNTER — Inpatient Hospital Stay (HOSPITAL_COMMUNITY): Admission: EM | Admit: 2009-01-24 | Discharge: 2009-01-27 | Payer: Self-pay | Admitting: Emergency Medicine

## 2009-01-25 ENCOUNTER — Encounter (INDEPENDENT_AMBULATORY_CARE_PROVIDER_SITE_OTHER): Payer: Self-pay | Admitting: Internal Medicine

## 2009-01-25 ENCOUNTER — Encounter: Payer: Self-pay | Admitting: Cardiovascular Disease

## 2009-01-27 ENCOUNTER — Ambulatory Visit: Payer: Self-pay | Admitting: Oncology

## 2009-01-29 ENCOUNTER — Ambulatory Visit: Payer: Self-pay | Admitting: Oncology

## 2009-01-29 LAB — CBC WITH DIFFERENTIAL/PLATELET
Basophils Absolute: 0 10*3/uL (ref 0.0–0.1)
Eosinophils Absolute: 0.1 10*3/uL (ref 0.0–0.5)
HCT: 37.2 % (ref 34.8–46.6)
HGB: 11.4 g/dL — ABNORMAL LOW (ref 11.6–15.9)
LYMPH%: 72.5 % — ABNORMAL HIGH (ref 14.0–49.7)
MONO#: 0.7 10*3/uL (ref 0.1–0.9)
NEUT#: 0.7 10*3/uL — ABNORMAL LOW (ref 1.5–6.5)
NEUT%: 13.1 % — ABNORMAL LOW (ref 38.4–76.8)
Platelets: 158 10*3/uL (ref 145–400)
WBC: 5.6 10*3/uL (ref 3.9–10.3)

## 2009-02-01 LAB — CBC WITH DIFFERENTIAL/PLATELET
BASO%: 0.6 % (ref 0.0–2.0)
Basophils Absolute: 0 10*3/uL (ref 0.0–0.1)
HCT: 36.2 % (ref 34.8–46.6)
HGB: 11.1 g/dL — ABNORMAL LOW (ref 11.6–15.9)
MCHC: 30.7 g/dL — ABNORMAL LOW (ref 31.5–36.0)
MONO#: 0.6 10*3/uL (ref 0.1–0.9)
NEUT%: 14.4 % — ABNORMAL LOW (ref 38.4–76.8)
WBC: 5.1 10*3/uL (ref 3.9–10.3)
lymph#: 3.7 10*3/uL — ABNORMAL HIGH (ref 0.9–3.3)

## 2009-02-01 LAB — PROTIME-INR

## 2009-02-05 LAB — CBC WITH DIFFERENTIAL/PLATELET
Basophils Absolute: 0 10*3/uL (ref 0.0–0.1)
EOS%: 1.7 % (ref 0.0–7.0)
HGB: 10.9 g/dL — ABNORMAL LOW (ref 11.6–15.9)
MCH: 22.6 pg — ABNORMAL LOW (ref 25.1–34.0)
MCV: 74.1 fL — ABNORMAL LOW (ref 79.5–101.0)
MONO%: 8.7 % (ref 0.0–14.0)
NEUT#: 0.9 10*3/uL — ABNORMAL LOW (ref 1.5–6.5)
RBC: 4.83 10*6/uL (ref 3.70–5.45)
RDW: 15.1 % — ABNORMAL HIGH (ref 11.2–14.5)
lymph#: 3.4 10*3/uL — ABNORMAL HIGH (ref 0.9–3.3)

## 2009-02-05 LAB — PROTIME-INR
INR: 3.4 (ref 2.00–3.50)
Protime: 40.8 Seconds — ABNORMAL HIGH (ref 10.6–13.4)

## 2009-02-23 LAB — PROTIME-INR: INR: 4.2 — ABNORMAL HIGH (ref 2.00–3.50)

## 2009-02-25 ENCOUNTER — Ambulatory Visit: Payer: Self-pay | Admitting: Oncology

## 2009-03-01 LAB — PROTIME-INR

## 2009-03-15 LAB — CBC WITH DIFFERENTIAL/PLATELET
BASO%: 0.1 % (ref 0.0–2.0)
Eosinophils Absolute: 0.1 10*3/uL (ref 0.0–0.5)
MCHC: 31.6 g/dL (ref 31.5–36.0)
MONO#: 0.3 10*3/uL (ref 0.1–0.9)
NEUT#: 0.9 10*3/uL — ABNORMAL LOW (ref 1.5–6.5)
RBC: 4.78 10*6/uL (ref 3.70–5.45)
RDW: 15.6 % — ABNORMAL HIGH (ref 11.2–14.5)
WBC: 5.7 10*3/uL (ref 3.9–10.3)
lymph#: 4.4 10*3/uL — ABNORMAL HIGH (ref 0.9–3.3)

## 2009-03-15 LAB — COMPREHENSIVE METABOLIC PANEL
ALT: 10 U/L (ref 0–35)
AST: 12 U/L (ref 0–37)
Albumin: 4.4 g/dL (ref 3.5–5.2)
BUN: 14 mg/dL (ref 6–23)
CO2: 20 mEq/L (ref 19–32)
Calcium: 9.6 mg/dL (ref 8.4–10.5)
Chloride: 107 mEq/L (ref 96–112)
Creatinine, Ser: 0.76 mg/dL (ref 0.40–1.20)
Potassium: 4 mEq/L (ref 3.5–5.3)

## 2009-03-15 LAB — MORPHOLOGY: PLT EST: ADEQUATE

## 2009-03-15 LAB — PROTIME-INR: INR: 3.2 (ref 2.00–3.50)

## 2009-03-15 LAB — LACTATE DEHYDROGENASE: LDH: 360 U/L — ABNORMAL HIGH (ref 94–250)

## 2009-03-17 ENCOUNTER — Encounter: Admission: RE | Admit: 2009-03-17 | Discharge: 2009-03-17 | Payer: Self-pay | Admitting: Oncology

## 2009-03-29 ENCOUNTER — Ambulatory Visit: Payer: Self-pay | Admitting: Oncology

## 2009-03-31 LAB — PROTIME-INR: Protime: 34.8 Seconds — ABNORMAL HIGH (ref 10.6–13.4)

## 2009-04-14 LAB — PROTIME-INR: Protime: 40.8 Seconds — ABNORMAL HIGH (ref 10.6–13.4)

## 2009-05-07 ENCOUNTER — Ambulatory Visit: Payer: Self-pay | Admitting: Oncology

## 2009-05-11 LAB — COMPREHENSIVE METABOLIC PANEL
ALT: 13 U/L (ref 0–35)
Albumin: 4.3 g/dL (ref 3.5–5.2)
CO2: 20 mEq/L (ref 19–32)
Calcium: 10.1 mg/dL (ref 8.4–10.5)
Chloride: 108 mEq/L (ref 96–112)
Glucose, Bld: 120 mg/dL — ABNORMAL HIGH (ref 70–99)
Potassium: 4.3 mEq/L (ref 3.5–5.3)
Sodium: 143 mEq/L (ref 135–145)
Total Protein: 6.2 g/dL (ref 6.0–8.3)

## 2009-05-11 LAB — LACTATE DEHYDROGENASE: LDH: 520 U/L — ABNORMAL HIGH (ref 94–250)

## 2009-05-11 LAB — MANUAL DIFFERENTIAL
ANC (CHCC manual diff): 1.6 10*3/uL (ref 1.5–6.5)
Band Neutrophils: 1 % (ref 0–10)
LYMPH: 92 % — ABNORMAL HIGH (ref 14–49)
PLT EST: ADEQUATE
SEG: 2 % — ABNORMAL LOW (ref 38–77)

## 2009-05-11 LAB — CBC WITH DIFFERENTIAL/PLATELET
HGB: 11.3 g/dL — ABNORMAL LOW (ref 11.6–15.9)
MCH: 23 pg — ABNORMAL LOW (ref 25.1–34.0)
MCV: 73.9 fL — ABNORMAL LOW (ref 79.5–101.0)
Platelets: 200 10*3/uL (ref 145–400)
WBC: 39.1 10*3/uL — ABNORMAL HIGH (ref 3.9–10.3)

## 2009-05-11 LAB — PROTIME-INR

## 2009-06-02 LAB — PROTIME-INR: Protime: 36 Seconds — ABNORMAL HIGH (ref 10.6–13.4)

## 2009-06-08 ENCOUNTER — Ambulatory Visit (HOSPITAL_COMMUNITY): Admission: RE | Admit: 2009-06-08 | Discharge: 2009-06-08 | Payer: Self-pay | Admitting: Oncology

## 2009-06-14 ENCOUNTER — Ambulatory Visit: Payer: Self-pay | Admitting: Oncology

## 2009-06-15 LAB — COMPREHENSIVE METABOLIC PANEL
ALT: 10 U/L (ref 0–35)
BUN: 18 mg/dL (ref 6–23)
CO2: 22 mEq/L (ref 19–32)
Calcium: 9.4 mg/dL (ref 8.4–10.5)
Chloride: 106 mEq/L (ref 96–112)
Creatinine, Ser: 1.09 mg/dL (ref 0.40–1.20)
Glucose, Bld: 134 mg/dL — ABNORMAL HIGH (ref 70–99)
Total Bilirubin: 0.4 mg/dL (ref 0.3–1.2)

## 2009-06-15 LAB — PROTIME-INR: INR: 2.9 (ref 2.00–3.50)

## 2009-06-15 LAB — CBC WITH DIFFERENTIAL/PLATELET
Basophils Absolute: 0.2 10*3/uL — ABNORMAL HIGH (ref 0.0–0.1)
EOS%: 0.3 % (ref 0.0–7.0)
Eosinophils Absolute: 0.2 10*3/uL (ref 0.0–0.5)
MCHC: 30.4 g/dL — ABNORMAL LOW (ref 31.5–36.0)
Platelets: 196 10*3/uL (ref 145–400)
RBC: 4.6 10*6/uL (ref 3.70–5.45)
RDW: 16.8 % — ABNORMAL HIGH (ref 11.2–14.5)
WBC: 69.3 10*3/uL (ref 3.9–10.3)

## 2009-06-15 LAB — MORPHOLOGY: PLT EST: ADEQUATE

## 2009-06-15 LAB — HOLD TUBE, BLOOD BANK

## 2009-06-30 LAB — CBC WITH DIFFERENTIAL/PLATELET
Basophils Absolute: 0.3 10*3/uL — ABNORMAL HIGH (ref 0.0–0.1)
EOS%: 0.2 % (ref 0.0–7.0)
HGB: 9.7 g/dL — ABNORMAL LOW (ref 11.6–15.9)
LYMPH%: 94.1 % — ABNORMAL HIGH (ref 14.0–49.7)
MCH: 23.7 pg — ABNORMAL LOW (ref 25.1–34.0)
MCV: 76.4 fL — ABNORMAL LOW (ref 79.5–101.0)
MONO%: 1.9 % (ref 0.0–14.0)
NEUT%: 3.5 % — ABNORMAL LOW (ref 38.4–76.8)
RDW: 18.3 % — ABNORMAL HIGH (ref 11.2–14.5)

## 2009-06-30 LAB — PROTIME-INR: INR: 2.8 (ref 2.00–3.50)

## 2009-07-06 LAB — PROTIME-INR: Protime: 39.6 Seconds — ABNORMAL HIGH (ref 10.6–13.4)

## 2009-07-13 LAB — CBC WITH DIFFERENTIAL/PLATELET
MCHC: 31 g/dL — ABNORMAL LOW (ref 31.5–36.0)
RBC: 3.93 10*6/uL (ref 3.70–5.45)
RDW: 18 % — ABNORMAL HIGH (ref 11.2–14.5)
WBC: 5 10*3/uL (ref 3.9–10.3)

## 2009-07-13 LAB — COMPREHENSIVE METABOLIC PANEL
Albumin: 3.9 g/dL (ref 3.5–5.2)
BUN: 10 mg/dL (ref 6–23)
CO2: 19 mEq/L (ref 19–32)
Calcium: 9.3 mg/dL (ref 8.4–10.5)
Chloride: 105 mEq/L (ref 96–112)
Glucose, Bld: 183 mg/dL — ABNORMAL HIGH (ref 70–99)
Potassium: 3.5 mEq/L (ref 3.5–5.3)

## 2009-07-13 LAB — MANUAL DIFFERENTIAL
ALC: 4.8 10*3/uL — ABNORMAL HIGH (ref 0.9–3.3)
EOS: 2 % (ref 0–7)
Metamyelocytes: 0 % (ref 0–0)
Myelocytes: 0 % (ref 0–0)
Other Cell: 0 % (ref 0–0)
PLT EST: ADEQUATE
PROMYELO: 0 % (ref 0–0)
SEG: 0 % — ABNORMAL LOW (ref 38–77)
Variant Lymph: 0 % (ref 0–0)

## 2009-07-13 LAB — PROTIME-INR: Protime: 30 Seconds — ABNORMAL HIGH (ref 10.6–13.4)

## 2009-07-13 LAB — LACTATE DEHYDROGENASE: LDH: 422 U/L — ABNORMAL HIGH (ref 94–250)

## 2009-07-13 LAB — URIC ACID: Uric Acid, Serum: 5.1 mg/dL (ref 2.4–7.0)

## 2009-07-26 ENCOUNTER — Ambulatory Visit: Payer: Self-pay | Admitting: Oncology

## 2009-07-28 LAB — CBC WITH DIFFERENTIAL/PLATELET
HGB: 9.5 g/dL — ABNORMAL LOW (ref 11.6–15.9)
MCH: 22.8 pg — ABNORMAL LOW (ref 25.1–34.0)
MCHC: 30.1 g/dL — ABNORMAL LOW (ref 31.5–36.0)
RDW: 17.9 % — ABNORMAL HIGH (ref 11.2–14.5)
nRBC: 0 % (ref 0–0)

## 2009-07-28 LAB — PROTIME-INR: Protime: 31.2 Seconds — ABNORMAL HIGH (ref 10.6–13.4)

## 2009-07-28 LAB — MANUAL DIFFERENTIAL
Basophil: 1 % (ref 0–2)
EOS: 6 % (ref 0–7)
Myelocytes: 0 % (ref 0–0)
PLT EST: ADEQUATE
PROMYELO: 0 % (ref 0–0)
SEG: 16 % — ABNORMAL LOW (ref 38–77)

## 2009-08-11 LAB — CBC WITH DIFFERENTIAL/PLATELET
HCT: 33.9 % — ABNORMAL LOW (ref 34.8–46.6)
HGB: 10.4 g/dL — ABNORMAL LOW (ref 11.6–15.9)
MCH: 23.4 pg — ABNORMAL LOW (ref 25.1–34.0)
MCV: 76.2 fL — ABNORMAL LOW (ref 79.5–101.0)

## 2009-08-11 LAB — MANUAL DIFFERENTIAL
Band Neutrophils: 0 % (ref 0–10)
Basophil: 1 % (ref 0–2)
Blasts: 0 % (ref 0–0)
EOS: 2 % (ref 0–7)
LYMPH: 80 % — ABNORMAL HIGH (ref 14–49)
nRBC: 0 % (ref 0–0)

## 2009-08-11 LAB — COMPREHENSIVE METABOLIC PANEL
Albumin: 4.3 g/dL (ref 3.5–5.2)
BUN: 10 mg/dL (ref 6–23)
Calcium: 9.8 mg/dL (ref 8.4–10.5)
Chloride: 106 mEq/L (ref 96–112)
Glucose, Bld: 93 mg/dL (ref 70–99)
Potassium: 3.7 mEq/L (ref 3.5–5.3)

## 2009-08-11 LAB — LACTATE DEHYDROGENASE: LDH: 296 U/L — ABNORMAL HIGH (ref 94–250)

## 2009-08-11 LAB — URIC ACID: Uric Acid, Serum: 6.7 mg/dL (ref 2.4–7.0)

## 2009-08-18 LAB — MANUAL DIFFERENTIAL
ALC: 2.8 10*3/uL (ref 0.9–3.3)
ANC (CHCC manual diff): 0.4 10*3/uL — CL (ref 1.5–6.5)
Blasts: 0 % (ref 0–0)
MONO: 5 % (ref 0–14)
Metamyelocytes: 0 % (ref 0–0)
Myelocytes: 0 % (ref 0–0)
Variant Lymph: 0 % (ref 0–0)

## 2009-08-18 LAB — CBC WITH DIFFERENTIAL/PLATELET
RBC: 4.47 10*6/uL (ref 3.70–5.45)
RDW: 16.6 % — ABNORMAL HIGH (ref 11.2–14.5)
WBC: 3.7 10*3/uL — ABNORMAL LOW (ref 3.9–10.3)

## 2009-08-25 ENCOUNTER — Ambulatory Visit: Payer: Self-pay | Admitting: Oncology

## 2009-08-25 LAB — MANUAL DIFFERENTIAL
ALC: 2.8 10*3/uL (ref 0.9–3.3)
ANC (CHCC manual diff): 0.3 10*3/uL — CL (ref 1.5–6.5)
Blasts: 0 % (ref 0–0)
MONO: 2 % (ref 0–14)
Myelocytes: 0 % (ref 0–0)
Other Cell: 0 % (ref 0–0)
PROMYELO: 0 % (ref 0–0)
SEG: 10 % — ABNORMAL LOW (ref 38–77)
Variant Lymph: 0 % (ref 0–0)

## 2009-08-25 LAB — CBC WITH DIFFERENTIAL/PLATELET
Platelets: 194 10*3/uL (ref 145–400)
RBC: 4.37 10*6/uL (ref 3.70–5.45)
RDW: 16.2 % — ABNORMAL HIGH (ref 11.2–14.5)
WBC: 3.4 10*3/uL — ABNORMAL LOW (ref 3.9–10.3)

## 2009-08-25 LAB — PROTIME-INR

## 2009-09-07 LAB — COMPREHENSIVE METABOLIC PANEL
ALT: 17 U/L (ref 0–35)
CO2: 19 mEq/L (ref 19–32)
Calcium: 9.4 mg/dL (ref 8.4–10.5)
Chloride: 107 mEq/L (ref 96–112)
Creatinine, Ser: 0.72 mg/dL (ref 0.40–1.20)
Glucose, Bld: 140 mg/dL — ABNORMAL HIGH (ref 70–99)

## 2009-09-07 LAB — MANUAL DIFFERENTIAL
ALC: 2 10*3/uL (ref 0.9–3.3)
ANC (CHCC manual diff): 0.2 10*3/uL — CL (ref 1.5–6.5)
Band Neutrophils: 2 % (ref 0–10)
LYMPH: 75 % — ABNORMAL HIGH (ref 14–49)
Other Cell: 0 % (ref 0–0)
PLT EST: ADEQUATE
PROMYELO: 0 % (ref 0–0)
SEG: 5 % — ABNORMAL LOW (ref 38–77)
Variant Lymph: 3 % — ABNORMAL HIGH (ref 0–0)
nRBC: 0 % (ref 0–0)

## 2009-09-07 LAB — LACTATE DEHYDROGENASE: LDH: 323 U/L — ABNORMAL HIGH (ref 94–250)

## 2009-09-07 LAB — CBC WITH DIFFERENTIAL/PLATELET
HGB: 10.7 g/dL — ABNORMAL LOW (ref 11.6–15.9)
MCV: 75.8 fL — ABNORMAL LOW (ref 79.5–101.0)
Platelets: 215 10*3/uL (ref 145–400)
WBC: 2.6 10*3/uL — ABNORMAL LOW (ref 3.9–10.3)

## 2009-09-07 LAB — PROTIME-INR

## 2009-09-07 LAB — URIC ACID: Uric Acid, Serum: 7 mg/dL (ref 2.4–7.0)

## 2009-09-22 LAB — CBC WITH DIFFERENTIAL/PLATELET
HCT: 33.2 % — ABNORMAL LOW (ref 34.8–46.6)
HGB: 10.5 g/dL — ABNORMAL LOW (ref 11.6–15.9)
MCH: 24 pg — ABNORMAL LOW (ref 25.1–34.0)
MCHC: 31.6 g/dL (ref 31.5–36.0)
MCV: 75.9 fL — ABNORMAL LOW (ref 79.5–101.0)
Platelets: 214 10e3/uL (ref 145–400)
RBC: 4.38 10e6/uL (ref 3.70–5.45)
RDW: 15.5 % — ABNORMAL HIGH (ref 11.2–14.5)
WBC: 4.2 10e3/uL (ref 3.9–10.3)

## 2009-09-22 LAB — MANUAL DIFFERENTIAL
ALC: 3.4 10e3/uL — ABNORMAL HIGH (ref 0.9–3.3)
ANC (CHCC manual diff): 0.5 10e3/uL — ABNORMAL LOW (ref 1.5–6.5)
Band Neutrophils: 3 % (ref 0–10)
Basophil: 0 % (ref 0–2)
Blasts: 0 % (ref 0–0)
EOS: 1 % (ref 0–7)
LYMPH: 81 % — ABNORMAL HIGH (ref 14–49)
MONO: 5 % (ref 0–14)
Metamyelocytes: 0 % (ref 0–0)
Myelocytes: 0 % (ref 0–0)
Other Cell: 0 % (ref 0–0)
PLT EST: ADEQUATE
PROMYELO: 0 % (ref 0–0)
SEG: 10 % — ABNORMAL LOW (ref 38–77)
Variant Lymph: 0 % (ref 0–0)
nRBC: 0 % (ref 0–0)

## 2009-09-28 ENCOUNTER — Ambulatory Visit: Payer: Self-pay | Admitting: Oncology

## 2009-09-30 LAB — PROTIME-INR: INR: 1.7 — ABNORMAL LOW (ref 2.00–3.50)

## 2009-10-19 LAB — MANUAL DIFFERENTIAL
Basophil: 1 % (ref 0–2)
EOS: 2 % (ref 0–7)
MONO: 7 % (ref 0–14)
Metamyelocytes: 0 % (ref 0–0)
Myelocytes: 0 % (ref 0–0)
Other Cell: 0 % (ref 0–0)
PLT EST: ADEQUATE

## 2009-10-19 LAB — CBC WITH DIFFERENTIAL/PLATELET
MCHC: 31.3 g/dL — ABNORMAL LOW (ref 31.5–36.0)
RBC: 4.83 10*6/uL (ref 3.70–5.45)
RDW: 15.9 % — ABNORMAL HIGH (ref 11.2–14.5)

## 2009-10-19 LAB — COMPREHENSIVE METABOLIC PANEL
ALT: 13 U/L (ref 0–35)
CO2: 25 mEq/L (ref 19–32)
Creatinine, Ser: 0.92 mg/dL (ref 0.40–1.20)
Total Bilirubin: 0.4 mg/dL (ref 0.3–1.2)

## 2009-10-19 LAB — LACTATE DEHYDROGENASE: LDH: 387 U/L — ABNORMAL HIGH (ref 94–250)

## 2009-10-19 LAB — PROTIME-INR
INR: 1.9 — ABNORMAL LOW (ref 2.00–3.50)
Protime: 22.8 Seconds — ABNORMAL HIGH (ref 10.6–13.4)

## 2009-11-12 ENCOUNTER — Ambulatory Visit: Payer: Self-pay | Admitting: Oncology

## 2009-11-16 LAB — MANUAL DIFFERENTIAL
ANC (CHCC manual diff): 0.5 10*3/uL — ABNORMAL LOW (ref 1.5–6.5)
Basophil: 0 % (ref 0–2)
EOS: 5 % (ref 0–7)
Myelocytes: 0 % (ref 0–0)
Other Cell: 0 % (ref 0–0)
PROMYELO: 0 % (ref 0–0)
SEG: 9 % — ABNORMAL LOW (ref 38–77)

## 2009-11-16 LAB — CBC WITH DIFFERENTIAL/PLATELET
HGB: 11 g/dL — ABNORMAL LOW (ref 11.6–15.9)
Platelets: 177 10*3/uL (ref 145–400)
RBC: 4.71 10*6/uL (ref 3.70–5.45)
WBC: 2.9 10*3/uL — ABNORMAL LOW (ref 3.9–10.3)

## 2009-11-16 LAB — COMPREHENSIVE METABOLIC PANEL
ALT: 14 U/L (ref 0–35)
AST: 16 U/L (ref 0–37)
Albumin: 4.2 g/dL (ref 3.5–5.2)
Alkaline Phosphatase: 155 U/L — ABNORMAL HIGH (ref 39–117)
Potassium: 4.1 mEq/L (ref 3.5–5.3)
Sodium: 141 mEq/L (ref 135–145)
Total Protein: 6 g/dL (ref 6.0–8.3)

## 2009-11-16 LAB — PROTIME-INR: INR: 1.7 — ABNORMAL LOW (ref 2.00–3.50)

## 2009-11-30 LAB — PROTIME-INR: INR: 3.5 (ref 2.00–3.50)

## 2009-11-30 LAB — MORPHOLOGY

## 2009-11-30 LAB — CBC WITH DIFFERENTIAL/PLATELET
EOS%: 2.5 % (ref 0.0–7.0)
HGB: 11 g/dL — ABNORMAL LOW (ref 11.6–15.9)
MCH: 23.4 pg — ABNORMAL LOW (ref 25.1–34.0)
MCV: 74.7 fL — ABNORMAL LOW (ref 79.5–101.0)
MONO%: 13.5 % (ref 0.0–14.0)
NEUT#: 1.9 10*3/uL (ref 1.5–6.5)
RBC: 4.7 10*6/uL (ref 3.70–5.45)
RDW: 15.8 % — ABNORMAL HIGH (ref 11.2–14.5)
lymph#: 1.1 10*3/uL (ref 0.9–3.3)
nRBC: 0 % (ref 0–0)

## 2009-12-07 LAB — PROTIME-INR
INR: 2.6 (ref 2.00–3.50)
Protime: 31.2 Seconds — ABNORMAL HIGH (ref 10.6–13.4)

## 2009-12-13 ENCOUNTER — Ambulatory Visit: Payer: Self-pay | Admitting: Oncology

## 2009-12-15 ENCOUNTER — Encounter: Payer: Self-pay | Admitting: Cardiovascular Disease

## 2009-12-15 ENCOUNTER — Ambulatory Visit (HOSPITAL_COMMUNITY): Admission: RE | Admit: 2009-12-15 | Discharge: 2009-12-15 | Payer: Self-pay | Admitting: Oncology

## 2009-12-15 LAB — COMPREHENSIVE METABOLIC PANEL
ALT: 22 U/L (ref 0–35)
CO2: 27 mEq/L (ref 19–32)
Calcium: 9.9 mg/dL (ref 8.4–10.5)
Chloride: 103 mEq/L (ref 96–112)
Glucose, Bld: 106 mg/dL — ABNORMAL HIGH (ref 70–99)
Sodium: 139 mEq/L (ref 135–145)
Total Protein: 6.3 g/dL (ref 6.0–8.3)

## 2009-12-15 LAB — CBC WITH DIFFERENTIAL/PLATELET
BASO%: 1.2 % (ref 0.0–2.0)
EOS%: 7.7 % — ABNORMAL HIGH (ref 0.0–7.0)
HCT: 36.4 % (ref 34.8–46.6)
MCH: 23.7 pg — ABNORMAL LOW (ref 25.1–34.0)
MCHC: 31.4 g/dL — ABNORMAL LOW (ref 31.5–36.0)
MONO#: 0.4 10*3/uL (ref 0.1–0.9)
NEUT%: 30.2 % — ABNORMAL LOW (ref 38.4–76.8)
RBC: 4.82 10*6/uL (ref 3.70–5.45)
RDW: 16.1 % — ABNORMAL HIGH (ref 11.2–14.5)
WBC: 2.8 10*3/uL — ABNORMAL LOW (ref 3.9–10.3)
lymph#: 1.3 10*3/uL (ref 0.9–3.3)

## 2009-12-15 LAB — MORPHOLOGY

## 2009-12-15 LAB — LACTATE DEHYDROGENASE: LDH: 328 U/L — ABNORMAL HIGH (ref 94–250)

## 2009-12-28 LAB — MORPHOLOGY

## 2009-12-28 LAB — CBC WITH DIFFERENTIAL/PLATELET
BASO%: 3 % — ABNORMAL HIGH (ref 0.0–2.0)
MCH: 23.3 pg — ABNORMAL LOW (ref 25.1–34.0)
MCHC: 30.9 g/dL — ABNORMAL LOW (ref 31.5–36.0)
MCV: 75.3 fL — ABNORMAL LOW (ref 79.5–101.0)
Platelets: 170 10*3/uL (ref 145–400)
RBC: 4.73 10*6/uL (ref 3.70–5.45)
RDW: 14.9 % — ABNORMAL HIGH (ref 11.2–14.5)

## 2009-12-28 LAB — PROTIME-INR

## 2010-01-04 ENCOUNTER — Encounter: Payer: Self-pay | Admitting: Cardiovascular Disease

## 2010-01-04 ENCOUNTER — Ambulatory Visit (HOSPITAL_COMMUNITY): Admission: RE | Admit: 2010-01-04 | Discharge: 2010-01-04 | Payer: Self-pay | Admitting: Internal Medicine

## 2010-01-24 ENCOUNTER — Ambulatory Visit: Payer: Self-pay | Admitting: Oncology

## 2010-01-26 LAB — CBC WITH DIFFERENTIAL/PLATELET
EOS%: 5.1 % (ref 0.0–7.0)
Eosinophils Absolute: 0.1 10*3/uL (ref 0.0–0.5)
MCV: 72 fL — ABNORMAL LOW (ref 79.5–101.0)
MONO%: 14.6 % — ABNORMAL HIGH (ref 0.0–14.0)
NEUT#: 0.5 10*3/uL — ABNORMAL LOW (ref 1.5–6.5)
RBC: 4.83 10*6/uL (ref 3.70–5.45)
RDW: 13.6 % (ref 11.2–14.5)
WBC: 2 10*3/uL — ABNORMAL LOW (ref 3.9–10.3)
lymph#: 1.1 10*3/uL (ref 0.9–3.3)
nRBC: 0 % (ref 0–0)

## 2010-01-26 LAB — MORPHOLOGY: PLT EST: ADEQUATE

## 2010-01-26 LAB — PROTIME-INR
INR: 2.2 (ref 2.00–3.50)
Protime: 26.4 Seconds — ABNORMAL HIGH (ref 10.6–13.4)

## 2010-01-31 DIAGNOSIS — Z8672 Personal history of thrombophlebitis: Secondary | ICD-10-CM | POA: Insufficient documentation

## 2010-01-31 DIAGNOSIS — R9431 Abnormal electrocardiogram [ECG] [EKG]: Secondary | ICD-10-CM | POA: Insufficient documentation

## 2010-01-31 DIAGNOSIS — R0989 Other specified symptoms and signs involving the circulatory and respiratory systems: Secondary | ICD-10-CM

## 2010-01-31 DIAGNOSIS — R0609 Other forms of dyspnea: Secondary | ICD-10-CM | POA: Insufficient documentation

## 2010-01-31 DIAGNOSIS — M1990 Unspecified osteoarthritis, unspecified site: Secondary | ICD-10-CM | POA: Insufficient documentation

## 2010-01-31 DIAGNOSIS — E559 Vitamin D deficiency, unspecified: Secondary | ICD-10-CM | POA: Insufficient documentation

## 2010-01-31 DIAGNOSIS — E669 Obesity, unspecified: Secondary | ICD-10-CM

## 2010-01-31 DIAGNOSIS — R079 Chest pain, unspecified: Secondary | ICD-10-CM | POA: Insufficient documentation

## 2010-01-31 DIAGNOSIS — C9110 Chronic lymphocytic leukemia of B-cell type not having achieved remission: Secondary | ICD-10-CM

## 2010-01-31 DIAGNOSIS — I2699 Other pulmonary embolism without acute cor pulmonale: Secondary | ICD-10-CM

## 2010-02-02 ENCOUNTER — Ambulatory Visit: Payer: Self-pay | Admitting: Cardiovascular Disease

## 2010-02-02 ENCOUNTER — Encounter: Payer: Self-pay | Admitting: Cardiovascular Disease

## 2010-02-03 LAB — PROTIME-INR
INR: 1.8 — ABNORMAL LOW (ref 2.00–3.50)
Protime: 21.6 Seconds — ABNORMAL HIGH (ref 10.6–13.4)

## 2010-02-09 ENCOUNTER — Emergency Department (HOSPITAL_COMMUNITY)
Admission: EM | Admit: 2010-02-09 | Discharge: 2010-02-10 | Payer: Self-pay | Source: Home / Self Care | Admitting: Emergency Medicine

## 2010-02-17 LAB — PROTIME-INR: Protime: 28.8 Seconds — ABNORMAL HIGH (ref 10.6–13.4)

## 2010-02-24 ENCOUNTER — Ambulatory Visit (HOSPITAL_BASED_OUTPATIENT_CLINIC_OR_DEPARTMENT_OTHER): Payer: Medicare Other | Admitting: Oncology

## 2010-02-28 LAB — CBC WITH DIFFERENTIAL/PLATELET
BASO%: 0.3 % (ref 0.0–2.0)
Basophils Absolute: 0 10*3/uL (ref 0.0–0.1)
EOS%: 2.8 % (ref 0.0–7.0)
Eosinophils Absolute: 0.1 10*3/uL (ref 0.0–0.5)
HCT: 32.7 % — ABNORMAL LOW (ref 34.8–46.6)
HGB: 10.5 g/dL — ABNORMAL LOW (ref 11.6–15.9)
LYMPH%: 82.6 % — ABNORMAL HIGH (ref 14.0–49.7)
MCH: 23.5 pg — ABNORMAL LOW (ref 25.1–34.0)
MCHC: 32.2 g/dL (ref 31.5–36.0)
MCV: 73.1 fL — ABNORMAL LOW (ref 79.5–101.0)
MONO#: 0 10*3/uL — ABNORMAL LOW (ref 0.1–0.9)
MONO%: 0.4 % (ref 0.0–14.0)
NEUT#: 0.6 10*3/uL — ABNORMAL LOW (ref 1.5–6.5)
NEUT%: 13.9 % — ABNORMAL LOW (ref 38.4–76.8)
Platelets: 173 10*3/uL (ref 145–400)
RBC: 4.47 10*6/uL (ref 3.70–5.45)
RDW: 15.1 % — ABNORMAL HIGH (ref 11.2–14.5)
WBC: 4.5 10*3/uL (ref 3.9–10.3)
lymph#: 3.7 10*3/uL — ABNORMAL HIGH (ref 0.9–3.3)

## 2010-02-28 LAB — COMPREHENSIVE METABOLIC PANEL
ALT: 13 U/L (ref 0–35)
AST: 15 U/L (ref 0–37)
Albumin: 4.1 g/dL (ref 3.5–5.2)
Alkaline Phosphatase: 181 U/L — ABNORMAL HIGH (ref 39–117)
BUN: 10 mg/dL (ref 6–23)
CO2: 22 mEq/L (ref 19–32)
Calcium: 9.7 mg/dL (ref 8.4–10.5)
Chloride: 109 mEq/L (ref 96–112)
Creatinine, Ser: 0.93 mg/dL (ref 0.40–1.20)
Glucose, Bld: 178 mg/dL — ABNORMAL HIGH (ref 70–99)
Potassium: 3.7 mEq/L (ref 3.5–5.3)
Sodium: 144 mEq/L (ref 135–145)
Total Bilirubin: 0.4 mg/dL (ref 0.3–1.2)
Total Protein: 5.9 g/dL — ABNORMAL LOW (ref 6.0–8.3)

## 2010-02-28 LAB — LACTATE DEHYDROGENASE: LDH: 409 U/L — ABNORMAL HIGH (ref 94–250)

## 2010-02-28 LAB — PROTIME-INR
INR: 2.2 (ref 2.00–3.50)
Protime: 26.4 Seconds — ABNORMAL HIGH (ref 10.6–13.4)

## 2010-02-28 LAB — MORPHOLOGY: PLT EST: ADEQUATE

## 2010-03-13 ENCOUNTER — Encounter: Payer: Self-pay | Admitting: Oncology

## 2010-03-14 ENCOUNTER — Encounter: Payer: Self-pay | Admitting: Oncology

## 2010-03-18 ENCOUNTER — Encounter
Admission: RE | Admit: 2010-03-18 | Discharge: 2010-03-18 | Payer: Self-pay | Source: Home / Self Care | Attending: Oncology | Admitting: Oncology

## 2010-03-24 NOTE — Letter (Signed)
Summary: [facility]  [facility]   Imported By: Marylou Mccoy 02/22/2010 17:02:33  _____________________________________________________________________  External Attachment:    Type:   Image     Comment:   External Document

## 2010-03-24 NOTE — Letter (Signed)
Summary: Echo  Echo   Imported By: Marylou Mccoy 02/22/2010 17:07:40  _____________________________________________________________________  External Attachment:    Type:   Image     Comment:   External Document

## 2010-03-24 NOTE — Assessment & Plan Note (Signed)
Summary: np6/ chestpain-pressure, abn ekg, pt has medicare, uhc. gd   Visit Type:  np Referring Provider:  Dr Neldon Labella Primary Provider:  Dr. Oneta Rack  CC:  left arm pain and swelling in ankles all the time.  History of Present Illness: 70 yo AAF with history of HTN, hyperlipidemia , CLL and DM who is referred today as a new cardiac patient for evaluation of chest pain and left arm pain. She tells me that she has had a cardiac catheterization in the early 90s and was told that it was normal. She has had no further cardiac problems since that time. She did have an echo in December 2010 while being admitted at [facility] with chest pain and was found to have bilateral pulmonary emboli. She has active CLL and recently finished chemotherapy recently. She has been on coumadin since 2002 at the time of a DVT in her right leg.   She tells me that she is here today for evaluation of left arm pain and pressure in her left chest. The pressure is mild over the upper abdomen and lower left chest. She burped that morning and it felt better. This occurred on January 04, 2010.  No associated SOB, dizziness, diaphoresis or nausea. The pressure is upper abdominal also. The pressure is much better since she has been taking her PPI. She has tingling in her left arm and hand. She was seen by an orthopedic surgeon yesterday and is felt to have carpal tunnel syndrome.   Current Medications (verified): 1)  Atenolol 50 Mg Tabs (Atenolol) .Marland Kitchen.. 1 Tab Once Daily 2)  Aspirin 81 Mg Tbec (Aspirin) .... Take One Tablet By Mouth Daily 3)  Omeprazole 20 Mg Cpdr (Omeprazole) .Marland Kitchen.. 1 Tab Once Daily As Needed 4)  Warfarin Sodium 5 Mg Tabs (Warfarin Sodium) .... As Directed 5)  Benadryl 25 Mg Tabs (Diphenhydramine Hcl) .... Prn 6)  Protonix 40 Mg Pack (Pantoprazole Sodium) .... As Needed 7)  Vitamin D3 50,000 Units .... Three Times A Week 8)  Tylenol 325 Mg Tabs (Acetaminophen) .... Prn 9)  Prochlorperazine Maleate 10 Mg  Tabs (Prochlorperazine Maleate) .... Prn 10)  Welchol 3.75 Gm Pack (Colesevelam Hcl) .... Daily 11)  Oxycodone-Acetaminophen 5-325 Mg Tabs (Oxycodone-Acetaminophen) .... Prn 12)  Septra Ds 800-160 Mg Tabs (Sulfamethoxazole-Trimethoprim) .Marland Kitchen.. 1 Tab Three Times Aweek 13)  Valtrex 500 Mg Tabs (Valacyclovir Hcl) .Marland Kitchen.. 1 Tab Daily  Allergies (verified): No Known Drug Allergies  Past History:  Past Medical History: PULMONARY EMBOLISM-bilateral December 2010 on coumadin now OBESITY (ICD-278.00) HTN Hyperlipidemia DM-Type II, diet controlled DVT LEUKEMIA, LYMPHOCYTIC, CHRONIC  VITAMIN D DEFICIENCY  DEGENERATIVE JOINT DISEASE  both knees  Past Surgical History: Bilateral endoscopic sinus surgery  Port-A-Cath implantation Hysterectomy 1990 Tonsillectomy Arthroscopic knee surgery  Family History: Mother-deceased, CHF and diabetes.   Father-deceased, ? unknown status  2 brothers and 3 sisters-no CAD  Maternal uncle and aunt  with CAD and MI      Social History: No tobacco No alcohol No illicit drugs Widow, 4 children Retired-Social Services  Review of Systems       The patient complains of chest pain.  The patient denies fatigue, malaise, fever, weight gain/loss, vision loss, decreased hearing, hoarseness, palpitations, shortness of breath, prolonged cough, wheezing, sleep apnea, coughing up blood, abdominal pain, blood in stool, nausea, vomiting, diarrhea, heartburn, incontinence, blood in urine, muscle weakness, joint pain, leg swelling, rash, skin lesions, headache, fainting, dizziness, depression, anxiety, enlarged lymph nodes, easy bruising or bleeding, and environmental allergies.  Vital Signs:  Patient profile:   70 year old female Height:      63 inches Weight:      272 pounds BMI:     48.36 Pulse rate:   98 / minute Resp:     17 per minute BP sitting:   136 / 78  (left arm) Cuff size:   large  Vitals Entered By: Haze Boyden, CMA (February 02, 2010 3:18  PM)  Physical Exam  General:  General: Well developed, well nourished, NAD HEENT: OP clear, mucus membranes moist SKIN: warm, dry Neuro: No focal deficits Musculoskeletal: Muscle strength 5/5 all ext Psychiatric: Mood and affect normal Neck: No JVD, no carotid bruits, no thyromegaly, no lymphadenopathy. Lungs:Clear bilaterally, no wheezes, rhonci, crackles CV: RRR no murmurs, gallops rubs Abdomen: soft, NT, ND, BS present Extremities: No edema, pulses 2+.    EKG  Procedure date:  02/02/2010  Findings:      NSR, rate 98 bpm. Normal EKG.   Impression & Recommendations:  Problem # 1:  CHEST PAIN (ICD-786.50) Atypical chest pain, improved on PPI. Most likely related to GERD. Her EKG is normal. No exertional chest pain or pressure. No further cardiac workup at this time. I will schedule her for return visit in 6 weeks. If she has no recurrence of chest pressure over the next six weeks, she will cancel her appt. I am hopeful that the symptoms will be controlled with the proton pump inhibitor.   Her updated medication list for this problem includes:    Atenolol 50 Mg Tabs (Atenolol) .Marland Kitchen... 1 tab once daily    Aspirin 81 Mg Tbec (Aspirin) .Marland Kitchen... Take one tablet by mouth daily    Warfarin Sodium 5 Mg Tabs (Warfarin sodium) .Marland Kitchen... As directed  Orders: EKG w/ Interpretation (93000)  Patient Instructions: 1)  Your physician recommends that you schedule a follow-up appointment in: 6 weeks.  2)  Your physician recommends that you continue on your current medications as directed. Please refer to the Current Medication list given to you today. 3)  Please make sure you take your PROTONIX on a daily basis.

## 2010-03-25 ENCOUNTER — Encounter (HOSPITAL_BASED_OUTPATIENT_CLINIC_OR_DEPARTMENT_OTHER): Payer: Medicare Other | Admitting: Oncology

## 2010-03-25 DIAGNOSIS — Z7901 Long term (current) use of anticoagulants: Secondary | ICD-10-CM

## 2010-03-25 DIAGNOSIS — Z86718 Personal history of other venous thrombosis and embolism: Secondary | ICD-10-CM

## 2010-03-25 DIAGNOSIS — C9110 Chronic lymphocytic leukemia of B-cell type not having achieved remission: Secondary | ICD-10-CM

## 2010-03-25 LAB — PROTIME-INR: Protime: 24 Seconds — ABNORMAL HIGH (ref 10.6–13.4)

## 2010-04-11 ENCOUNTER — Other Ambulatory Visit: Payer: Self-pay | Admitting: Oncology

## 2010-04-11 ENCOUNTER — Encounter (HOSPITAL_BASED_OUTPATIENT_CLINIC_OR_DEPARTMENT_OTHER): Payer: Medicare Other | Admitting: Oncology

## 2010-04-11 DIAGNOSIS — Z7901 Long term (current) use of anticoagulants: Secondary | ICD-10-CM

## 2010-04-11 DIAGNOSIS — C9110 Chronic lymphocytic leukemia of B-cell type not having achieved remission: Secondary | ICD-10-CM

## 2010-04-11 DIAGNOSIS — Z86718 Personal history of other venous thrombosis and embolism: Secondary | ICD-10-CM

## 2010-04-11 LAB — COMPREHENSIVE METABOLIC PANEL
Albumin: 4.2 g/dL (ref 3.5–5.2)
Alkaline Phosphatase: 172 U/L — ABNORMAL HIGH (ref 39–117)
BUN: 10 mg/dL (ref 6–23)
Creatinine, Ser: 0.81 mg/dL (ref 0.40–1.20)
Glucose, Bld: 158 mg/dL — ABNORMAL HIGH (ref 70–99)
Potassium: 3.8 mEq/L (ref 3.5–5.3)
Total Bilirubin: 0.5 mg/dL (ref 0.3–1.2)

## 2010-04-11 LAB — URIC ACID: Uric Acid, Serum: 6.7 mg/dL (ref 2.4–7.0)

## 2010-04-11 LAB — CBC WITH DIFFERENTIAL/PLATELET
Basophils Absolute: 0 10*3/uL (ref 0.0–0.1)
Eosinophils Absolute: 0.2 10*3/uL (ref 0.0–0.5)
HCT: 31.4 % — ABNORMAL LOW (ref 34.8–46.6)
HGB: 9.9 g/dL — ABNORMAL LOW (ref 11.6–15.9)
LYMPH%: 89.3 % — ABNORMAL HIGH (ref 14.0–49.7)
MONO#: 0.2 10*3/uL (ref 0.1–0.9)
NEUT#: 1.1 10*3/uL — ABNORMAL LOW (ref 1.5–6.5)
NEUT%: 7.6 % — ABNORMAL LOW (ref 38.4–76.8)
Platelets: 155 10*3/uL (ref 145–400)
RBC: 4.23 10*6/uL (ref 3.70–5.45)
WBC: 14.7 10*3/uL — ABNORMAL HIGH (ref 3.9–10.3)

## 2010-04-16 ENCOUNTER — Emergency Department (HOSPITAL_COMMUNITY)
Admission: EM | Admit: 2010-04-16 | Discharge: 2010-04-17 | Disposition: A | Discharge status: Home / Self Care | Payer: Medicare Other | Attending: Emergency Medicine | Admitting: Emergency Medicine

## 2010-04-16 ENCOUNTER — Emergency Department (HOSPITAL_COMMUNITY): Payer: Medicare Other

## 2010-04-16 DIAGNOSIS — Z7901 Long term (current) use of anticoagulants: Secondary | ICD-10-CM | POA: Insufficient documentation

## 2010-04-16 DIAGNOSIS — C9110 Chronic lymphocytic leukemia of B-cell type not having achieved remission: Secondary | ICD-10-CM | POA: Insufficient documentation

## 2010-04-16 DIAGNOSIS — R05 Cough: Secondary | ICD-10-CM | POA: Insufficient documentation

## 2010-04-16 DIAGNOSIS — R0602 Shortness of breath: Secondary | ICD-10-CM | POA: Insufficient documentation

## 2010-04-16 DIAGNOSIS — R059 Cough, unspecified: Secondary | ICD-10-CM | POA: Insufficient documentation

## 2010-04-16 DIAGNOSIS — R509 Fever, unspecified: Secondary | ICD-10-CM | POA: Insufficient documentation

## 2010-04-16 DIAGNOSIS — E119 Type 2 diabetes mellitus without complications: Secondary | ICD-10-CM | POA: Insufficient documentation

## 2010-04-16 DIAGNOSIS — Z86718 Personal history of other venous thrombosis and embolism: Secondary | ICD-10-CM | POA: Insufficient documentation

## 2010-04-16 DIAGNOSIS — R062 Wheezing: Secondary | ICD-10-CM | POA: Insufficient documentation

## 2010-04-16 LAB — CBC
HCT: 30.4 % — ABNORMAL LOW (ref 36.0–46.0)
MCV: 75.2 fL — ABNORMAL LOW (ref 78.0–100.0)
RBC: 4.04 MIL/uL (ref 3.87–5.11)
WBC: 11.9 10*3/uL — ABNORMAL HIGH (ref 4.0–10.5)

## 2010-04-16 LAB — BASIC METABOLIC PANEL
BUN: 7 mg/dL (ref 6–23)
Chloride: 108 mEq/L (ref 96–112)
Glucose, Bld: 136 mg/dL — ABNORMAL HIGH (ref 70–99)
Potassium: 3.6 mEq/L (ref 3.5–5.1)

## 2010-04-16 LAB — APTT: aPTT: 42 seconds — ABNORMAL HIGH (ref 24–37)

## 2010-04-16 LAB — DIFFERENTIAL
Basophils Relative: 0 % (ref 0–1)
Eosinophils Relative: 1 % (ref 0–5)
Lymphocytes Relative: 79 % — ABNORMAL HIGH (ref 12–46)
Neutrophils Relative %: 3 % — ABNORMAL LOW (ref 43–77)

## 2010-04-17 LAB — URINALYSIS, ROUTINE W REFLEX MICROSCOPIC
Bilirubin Urine: NEGATIVE
Ketones, ur: NEGATIVE mg/dL
Urine Glucose, Fasting: NEGATIVE mg/dL
pH: 5.5 (ref 5.0–8.0)

## 2010-04-17 MED ORDER — TECHNETIUM TO 99M ALBUMIN AGGREGATED
5.4000 | Freq: Once | INTRAVENOUS | Status: AC | PRN
  Administered 2010-04-17: 5 via INTRAVENOUS

## 2010-04-17 MED ORDER — XENON XE 133 GAS
6.3000 | GAS_FOR_INHALATION | Freq: Once | RESPIRATORY_TRACT | Status: AC | PRN
  Administered 2010-04-17: 6 via RESPIRATORY_TRACT

## 2010-04-19 LAB — URINE CULTURE
Colony Count: 25000
Culture  Setup Time: 201202261122

## 2010-04-25 ENCOUNTER — Other Ambulatory Visit: Payer: Self-pay | Admitting: Oncology

## 2010-04-25 ENCOUNTER — Encounter (HOSPITAL_BASED_OUTPATIENT_CLINIC_OR_DEPARTMENT_OTHER): Payer: Medicare Other | Admitting: Oncology

## 2010-04-25 ENCOUNTER — Inpatient Hospital Stay (HOSPITAL_COMMUNITY)
Admission: AD | Admit: 2010-04-25 | Discharge: 2010-05-05 | DRG: 194 | Disposition: A | Discharge status: Home / Self Care | Payer: Medicare Other | Source: Ambulatory Visit | Attending: Oncology | Admitting: Oncology

## 2010-04-25 ENCOUNTER — Inpatient Hospital Stay (HOSPITAL_COMMUNITY): Payer: Medicare Other

## 2010-04-25 DIAGNOSIS — J1100 Influenza due to unidentified influenza virus with unspecified type of pneumonia: Principal | ICD-10-CM | POA: Diagnosis present

## 2010-04-25 DIAGNOSIS — R32 Unspecified urinary incontinence: Secondary | ICD-10-CM | POA: Diagnosis present

## 2010-04-25 DIAGNOSIS — Z5181 Encounter for therapeutic drug level monitoring: Secondary | ICD-10-CM

## 2010-04-25 DIAGNOSIS — I2782 Chronic pulmonary embolism: Secondary | ICD-10-CM | POA: Diagnosis present

## 2010-04-25 DIAGNOSIS — I808 Phlebitis and thrombophlebitis of other sites: Secondary | ICD-10-CM

## 2010-04-25 DIAGNOSIS — Z7901 Long term (current) use of anticoagulants: Secondary | ICD-10-CM

## 2010-04-25 DIAGNOSIS — C9110 Chronic lymphocytic leukemia of B-cell type not having achieved remission: Secondary | ICD-10-CM | POA: Diagnosis present

## 2010-04-25 DIAGNOSIS — I82509 Chronic embolism and thrombosis of unspecified deep veins of unspecified lower extremity: Secondary | ICD-10-CM | POA: Diagnosis present

## 2010-04-25 DIAGNOSIS — T380X5A Adverse effect of glucocorticoids and synthetic analogues, initial encounter: Secondary | ICD-10-CM | POA: Diagnosis present

## 2010-04-25 DIAGNOSIS — R509 Fever, unspecified: Secondary | ICD-10-CM

## 2010-04-25 DIAGNOSIS — E139 Other specified diabetes mellitus without complications: Secondary | ICD-10-CM | POA: Diagnosis present

## 2010-04-25 DIAGNOSIS — D630 Anemia in neoplastic disease: Secondary | ICD-10-CM | POA: Diagnosis present

## 2010-04-25 DIAGNOSIS — R05 Cough: Secondary | ICD-10-CM

## 2010-04-25 LAB — URINALYSIS, ROUTINE W REFLEX MICROSCOPIC
Glucose, UA: NEGATIVE mg/dL
Leukocytes, UA: NEGATIVE
Specific Gravity, Urine: 1.015 (ref 1.005–1.030)
pH: 6.5 (ref 5.0–8.0)

## 2010-04-25 LAB — CBC WITH DIFFERENTIAL/PLATELET
Basophils Absolute: 0.1 10*3/uL (ref 0.0–0.1)
EOS%: 0.1 % (ref 0.0–7.0)
Eosinophils Absolute: 0 10*3/uL (ref 0.0–0.5)
HCT: 30.2 % — ABNORMAL LOW (ref 34.8–46.6)
HGB: 9.6 g/dL — ABNORMAL LOW (ref 11.6–15.9)
MCH: 23.3 pg — ABNORMAL LOW (ref 25.1–34.0)
MONO#: 0.1 10*3/uL (ref 0.1–0.9)
NEUT#: 0.4 10*3/uL — CL (ref 1.5–6.5)
NEUT%: 6.6 % — ABNORMAL LOW (ref 38.4–76.8)
lymph#: 5.4 10*3/uL — ABNORMAL HIGH (ref 0.9–3.3)

## 2010-04-25 LAB — COMPREHENSIVE METABOLIC PANEL
Albumin: 4 g/dL (ref 3.5–5.2)
Alkaline Phosphatase: 151 U/L — ABNORMAL HIGH (ref 39–117)
BUN: 15 mg/dL (ref 6–23)
CO2: 26 mEq/L (ref 19–32)
Glucose, Bld: 140 mg/dL — ABNORMAL HIGH (ref 70–99)
Total Bilirubin: 0.6 mg/dL (ref 0.3–1.2)

## 2010-04-25 LAB — PROTIME-INR

## 2010-04-25 LAB — URINE MICROSCOPIC-ADD ON

## 2010-04-26 DIAGNOSIS — R05 Cough: Secondary | ICD-10-CM

## 2010-04-26 DIAGNOSIS — C9110 Chronic lymphocytic leukemia of B-cell type not having achieved remission: Secondary | ICD-10-CM

## 2010-04-26 DIAGNOSIS — R509 Fever, unspecified: Secondary | ICD-10-CM

## 2010-04-26 LAB — PROTIME-INR: INR: 2.02 — ABNORMAL HIGH (ref 0.00–1.49)

## 2010-04-26 LAB — DIFFERENTIAL
Basophils Absolute: 0 10*3/uL (ref 0.0–0.1)
Basophils Relative: 0 % (ref 0–1)
Eosinophils Absolute: 0 10*3/uL (ref 0.0–0.7)
Lymphocytes Relative: 93 % — ABNORMAL HIGH (ref 12–46)
Monocytes Relative: 2 % — ABNORMAL LOW (ref 3–12)
Neutro Abs: 0.2 10*3/uL — ABNORMAL LOW (ref 1.7–7.7)
Neutrophils Relative %: 4 % — ABNORMAL LOW (ref 43–77)

## 2010-04-26 LAB — CBC
MCV: 74.3 fL — ABNORMAL LOW (ref 78.0–100.0)
Platelets: 137 10*3/uL — ABNORMAL LOW (ref 150–400)
RBC: 3.54 MIL/uL — ABNORMAL LOW (ref 3.87–5.11)
RDW: 16.9 % — ABNORMAL HIGH (ref 11.5–15.5)
WBC: 4.6 10*3/uL (ref 4.0–10.5)

## 2010-04-26 LAB — URINE CULTURE
Colony Count: NO GROWTH
Culture  Setup Time: 201203060248
Culture: NO GROWTH
Special Requests: NEGATIVE

## 2010-04-26 LAB — GLUCOSE, CAPILLARY
Glucose-Capillary: 175 mg/dL — ABNORMAL HIGH (ref 70–99)
Glucose-Capillary: 170 mg/dL — ABNORMAL HIGH (ref 70–99)

## 2010-04-27 ENCOUNTER — Inpatient Hospital Stay (HOSPITAL_COMMUNITY): Payer: Medicare Other

## 2010-04-27 LAB — DIFFERENTIAL
Basophils Absolute: 0 10*3/uL (ref 0.0–0.1)
Basophils Relative: 0 % (ref 0–1)
Eosinophils Absolute: 0 10*3/uL (ref 0.0–0.7)
Lymphocytes Relative: 91 % — ABNORMAL HIGH (ref 12–46)
Lymphs Abs: 3.7 10*3/uL (ref 0.7–4.0)
Monocytes Absolute: 0.1 10*3/uL (ref 0.1–1.0)
Neutro Abs: 0.2 10*3/uL — ABNORMAL LOW (ref 1.7–7.7)

## 2010-04-27 LAB — CBC
HCT: 25.5 % — ABNORMAL LOW (ref 36.0–46.0)
MCH: 22.8 pg — ABNORMAL LOW (ref 26.0–34.0)
MCHC: 31 g/dL (ref 30.0–36.0)
MCV: 73.7 fL — ABNORMAL LOW (ref 78.0–100.0)
RDW: 16.9 % — ABNORMAL HIGH (ref 11.5–15.5)

## 2010-04-27 LAB — PROTIME-INR: INR: 1.74 — ABNORMAL HIGH (ref 0.00–1.49)

## 2010-04-28 LAB — DIFFERENTIAL
Basophils Absolute: 0 10*3/uL (ref 0.0–0.1)
Lymphocytes Relative: 88 % — ABNORMAL HIGH (ref 12–46)
Monocytes Relative: 2 % — ABNORMAL LOW (ref 3–12)
Neutrophils Relative %: 9 % — ABNORMAL LOW (ref 43–77)

## 2010-04-28 LAB — PROTIME-INR
INR: 1.67 — ABNORMAL HIGH (ref 0.00–1.49)
Prothrombin Time: 19.9 seconds — ABNORMAL HIGH (ref 11.6–15.2)

## 2010-04-28 LAB — CBC
HCT: 25 % — ABNORMAL LOW (ref 36.0–46.0)
Hemoglobin: 7.6 g/dL — ABNORMAL LOW (ref 12.0–15.0)
RBC: 3.37 MIL/uL — ABNORMAL LOW (ref 3.87–5.11)
WBC: 2.9 10*3/uL — ABNORMAL LOW (ref 4.0–10.5)

## 2010-04-28 LAB — GLUCOSE, CAPILLARY: Glucose-Capillary: 169 mg/dL — ABNORMAL HIGH (ref 70–99)

## 2010-04-29 ENCOUNTER — Inpatient Hospital Stay (HOSPITAL_COMMUNITY): Payer: Medicare Other

## 2010-04-29 LAB — CBC
MCHC: 29.8 g/dL — ABNORMAL LOW (ref 30.0–36.0)
Platelets: 137 10*3/uL — ABNORMAL LOW (ref 150–400)
RDW: 16.9 % — ABNORMAL HIGH (ref 11.5–15.5)
WBC: 3 10*3/uL — ABNORMAL LOW (ref 4.0–10.5)

## 2010-04-29 LAB — BASIC METABOLIC PANEL
BUN: 5 mg/dL — ABNORMAL LOW (ref 6–23)
Calcium: 8.7 mg/dL (ref 8.4–10.5)
GFR calc non Af Amer: >60 mL/min (ref >60)
Potassium: 4.4 mEq/L (ref 3.5–5.1)
Sodium: 134 mEq/L — ABNORMAL LOW (ref 135–145)

## 2010-04-29 LAB — GLUCOSE, CAPILLARY: Glucose-Capillary: 148 mg/dL — ABNORMAL HIGH (ref 70–99)

## 2010-04-29 NOTE — H&P (Signed)
[name], [name]                 ACCOUNT NO.:  [id number]  MEDICAL RECORD NO.:  [id number]           PATIENT TYPE:  I  LOCATION:  1307                         FACILITY:  [facility]  PHYSICIAN:  Genene Churn. Ryann Leavitt, M.D.DATE OF BIRTH:  1940/03/06  DATE OF ADMISSION:  04/25/2010 DATE OF DISCHARGE:                             HISTORY & PHYSICAL   CHIEF COMPLAINT:  Cough, fever, neutropenia in a lady with chronic lymphocytic leukemia.  HISTORY OF PRESENT ILLNESS:  This is one of multiple hospital admissions for this 70 year old woman with long-standing chronic lymphocytic leukemia, initially diagnosed in December 2001.  She has had partial remissions to multiple different treatments.  She was initially treated with fludarabine with a 20-month response.  She was re-treated with fludarabine again.  She received Campath anti-CD52 antibody, September 2006 through December 2006, and then again, February 2008 through March 2008.  She received Treanda 6 cycles between October 2008 and April 2009.  She was then treated with Suzan Nailer, June 2010 through November 2010.  She was then treated with a combination of both the Treanda plus the Arzerra between May 2011 and November 18, 2009.  With each treatment, remission durations have been shorter and shorter.  Over the last 2-3 weeks, she has developed progressive constitutional symptoms with fever, sweats, and anorexia.  She had a followup consultation at [facility] last Wednesday with Dr. Latanya Presser. There are no current clinical trials that she would be eligible for.  He recommended salvage treatment with high-dose steroids plus Rituxan. Although she had a anaphylactoid reaction to Rituxan when it was first given many years ago, he felt that the high-dose steroids would likely abrogate any reaction.  He admitted her to [facility] on April 18, 2010. She received her first course of Solu-Medrol 1 g per meter squared daily for 3  doses and a dose of Rituxan 375 mg/m2.  She tolerated the treatment well.  She was discharged on Friday, March 2nd.  She called our office this morning to report a progressive hacking nonproductive cough, fever to 101.6 degrees, generalized weakness, and new urinary incontinence every time she coughed.  I advised her to come into the office for evaluation.  She appears acutely ill and I will admit her now for further evaluation and treatment.  Complications associated with her CLL have included recurrent sinus and pulmonary infections.  Multiple admissions for bronchitis and pneumonitis.  These improved significantly following surgery on her sinuses by Dr. Flo Shanks and with monthly administration of intravenous immunoglobulin.  She had an initial left lower extremity DVT in November 2002.  She was on long-term anticoagulation.  Off anticoagulation for just a few months, she had a acute pulmonary embolus in December 2010, and was put back on anticoagulation.  PAST MEDICAL HISTORY: 1. Advanced degenerative arthritis. 2. Morbid obesity. 3. Hypertension.  PAST SURGICAL HISTORY:  She has a Port-a-Cath in the right arm.  Recent cataract surgery.  CURRENT MEDICATIONS: 1. Ofloxacin eye drops 1 drop right eye every 6 hours, started last     week. 2. PCP prophylaxis with Bactrim DS 1 p.o. Monday, Wednesday,  Friday. 3. Antiviral prophylaxis with Valtrex 500 mg daily, started last week. 4. Diflucan antifungal prophylaxis 100 mg daily, started last week. 5. Regular human insulin on a sliding scale, started last week when     megadose steroids elevated her glucose. 6. Coumadin, previous dose, 10 mg daily, reduced to 5 mg daily at [facility]     when INR got above the therapeutic range. 7. Aspirin 81 mg daily. 8. Tylox 5/325 q.8 h. p.r.n. pain. 9. Combivent albuterol inhaler 2 puffs q.i.d. p.r.n. dyspnea or     wheezing. 10.Protonix 40 mg daily. 11.Atenolol 50 mg daily. 12.Maxzide 1  daily. 13.Compazine 10 mg q.6 h. p.r.n. nausea. 14.Pravastatin 40 mg daily. 15.Dalmane 30 mg h.s. p.r.n. sleep. 16.Recent reaction to WelChol, which cause severe constipation and     abdominal pain.  ALLERGIES:  No medication allergies.  FAMILY HISTORY:  Noncontributory.  SOCIAL HISTORY:  She lost her husband last year with lung cancer.  She has 2 grown sons.  No alcohol or tobacco.  ADDITIONAL REVIEW OF SYSTEMS:  She has had dyspnea and wheezing associated with her current cough.  No diarrhea.  No hematochezia or melena.  PHYSICAL EXAMINATION:  GENERAL:  An acutely ill-appearing lady. VITAL SIGNS:  Blood pressure 133/69, pulse 109 and regular, respirations 20, temperature 101.6. PHARYNX:  No erythema or exudate. NECK:  Supple. LUNGS:  With decreased breath sounds and coarse rales at the right base, left lung is clear.  Regular cardiac rhythm.  No murmur or gallop. There is minimal adenopathy in the neck and axillae compared to my exam of just 3 weeks ago, now post high-dose steroids. ABDOMEN:  Soft, obese, nontender.  No masses.  No organomegaly.  There is a Port-a-Cath infusion device in the right brachial position of her arm. EXTREMITIES:  With chronic 1+ edema.  No calf tenderness. NEUROLOGIC:  Mental status intact.  Cranial nerves grossly normal. Motor strength 5/5.  Reflexes absent, but symmetric.  LABORATORY DATA:  Hemoglobin 9.6, hematocrit 30, white count 5900 with 91% lymphocytes, 7% neutrophils, 400 absolute neutrophils, platelet count 171,000.  Chem profile with random glucose 140, alkaline phosphatase 151 down from 172 in February, serum total protein 5.9, SGOT 33, SGPT 38, bilirubin 0.6, calcium 9.7 with albumin 4.0.  IMAGING:  Chest radiograph, low lung volumes with decreased aeration at both lung bases, left greater than right "cannot rule out bibasilar airspace opacities due to pneumonia."  On my review of the x-ray studies, I am not impressed that she has  any pneumonia.  Diaphragms are sharp bilaterally.  IMPRESSION: 1. Fever, neutropenia, and cough in a severely immunocompromised     patient with underlying chronic leukemia.  Further     immunosuppression added by recent megadose steroid treatments given     last week.  Although there are no gross pulmonary infiltrates, and     although fever may be due to progression of her leukemia, I feel     she needs to be covered with antibiotics pending cultures.  I will     continue all of her prophylactic antivirals, antifungal and PCP     antibiotics and add in addition 4 tablets and azithromycin, pending     culture results. 2. Long-standing chronic lymphocytic leukemia. 3. Disease is now progressed on all active available treatments.     Recent salvage treatment with high-dose steroids and Rituxan.  To     early to assess response.  Unfortunately, even if she gets     response, anticipate  it will only be short term. 4. History of lower extremity deep venous thrombosis and subsequent     pulmonary embolus, on chronic Coumadin anticoagulation.  INR today     is 3.5.  I will hold her Coumadin and resume at a lower dose within     the next few days.  Mechanical thromboprophylaxis until then. 5. Essential hypertension. 6. Morbid obesity. 7. 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  I had a discussion in private with one of her sons today.  I think we are reaching the limit that what we can do to get her disease back under control and I anticipate major problems in the near future, especially severe infection.  Son expresses his understanding and will alert the family.     Genene Churn. Cyndie Chime, M.D.     Lottie Rater  D:  04/25/2010  T:  04/26/2010  Job:  [id number]  cc:   Dr. Latanya Presser Department of Hematology-Oncology, [facility]  Lucky Cowboy, M.D. Fax: [phone]  Electronically Signed by Cephas Darby M.D. on 04/28/2010 01:51:50 PM

## 2010-04-30 DIAGNOSIS — C9110 Chronic lymphocytic leukemia of B-cell type not having achieved remission: Secondary | ICD-10-CM

## 2010-04-30 LAB — DIFFERENTIAL
Basophils Absolute: 0 10*3/uL (ref 0.0–0.1)
Eosinophils Absolute: 0.1 10*3/uL (ref 0.0–0.7)
Lymphocytes Relative: 93 % — ABNORMAL HIGH (ref 12–46)
Monocytes Relative: 2 % — ABNORMAL LOW (ref 3–12)
Neutro Abs: 0.1 10*3/uL — ABNORMAL LOW (ref 1.7–7.7)
Neutrophils Relative %: 3 % — ABNORMAL LOW (ref 43–77)

## 2010-04-30 LAB — GLUCOSE, CAPILLARY
Glucose-Capillary: 127 mg/dL — ABNORMAL HIGH (ref 70–99)
Glucose-Capillary: 151 mg/dL — ABNORMAL HIGH (ref 70–99)

## 2010-04-30 LAB — CROSSMATCH

## 2010-04-30 LAB — CBC
Hemoglobin: 9.7 g/dL — ABNORMAL LOW (ref 12.0–15.0)
Platelets: 143 10*3/uL — ABNORMAL LOW (ref 150–400)
RBC: 4.18 MIL/uL (ref 3.87–5.11)
WBC: 3.6 10*3/uL — ABNORMAL LOW (ref 4.0–10.5)

## 2010-04-30 LAB — PROTIME-INR
INR: 1.61 — ABNORMAL HIGH (ref 0.00–1.49)
Prothrombin Time: 19.3 seconds — ABNORMAL HIGH (ref 11.6–15.2)

## 2010-05-01 LAB — CBC
HCT: 32.7 % — ABNORMAL LOW (ref 36.0–46.0)
Hemoglobin: 10.1 g/dL — ABNORMAL LOW (ref 12.0–15.0)
MCHC: 30.9 g/dL (ref 30.0–36.0)
MCV: 76.8 fL — ABNORMAL LOW (ref 78.0–100.0)
RDW: 17 % — ABNORMAL HIGH (ref 11.5–15.5)

## 2010-05-01 LAB — DIFFERENTIAL
Basophils Relative: 0 % (ref 0–1)
Eosinophils Relative: 2 % (ref 0–5)
Lymphs Abs: 1.9 10*3/uL (ref 0.7–4.0)
Monocytes Absolute: 0.3 10*3/uL (ref 0.1–1.0)
Monocytes Relative: 14 % — ABNORMAL HIGH (ref 3–12)

## 2010-05-01 LAB — GLUCOSE, CAPILLARY
Glucose-Capillary: 146 mg/dL — ABNORMAL HIGH (ref 70–99)
Glucose-Capillary: 137 mg/dL — ABNORMAL HIGH (ref 70–99)

## 2010-05-01 LAB — CULTURE, BLOOD (ROUTINE X 2)
Culture  Setup Time: 201203051929
Culture  Setup Time: 201203051929
Culture: NO GROWTH

## 2010-05-02 ENCOUNTER — Inpatient Hospital Stay (HOSPITAL_COMMUNITY): Payer: Medicare Other

## 2010-05-02 DIAGNOSIS — D709 Neutropenia, unspecified: Secondary | ICD-10-CM

## 2010-05-02 DIAGNOSIS — J111 Influenza due to unidentified influenza virus with other respiratory manifestations: Secondary | ICD-10-CM

## 2010-05-02 DIAGNOSIS — C9110 Chronic lymphocytic leukemia of B-cell type not having achieved remission: Secondary | ICD-10-CM

## 2010-05-02 DIAGNOSIS — R32 Unspecified urinary incontinence: Secondary | ICD-10-CM

## 2010-05-02 LAB — DIFFERENTIAL
Band Neutrophils: 0 % (ref 0–10)
Basophils Absolute: 0 10*3/uL (ref 0.0–0.1)
Basophils Absolute: 0 10*3/uL (ref 0.0–0.1)
Basophils Relative: 0 % (ref 0–1)
Lymphocytes Relative: 68 % — ABNORMAL HIGH (ref 12–46)
Lymphs Abs: 1.8 10*3/uL (ref 0.7–4.0)
Lymphs Abs: 1.6 10*3/uL (ref 0.7–4.0)
Monocytes Absolute: 0.3 10*3/uL (ref 0.1–1.0)
Monocytes Relative: 12 % (ref 3–12)
Monocytes Relative: 11 % (ref 3–12)
Neutro Abs: 0.4 10*3/uL — ABNORMAL LOW (ref 1.7–7.7)

## 2010-05-02 LAB — COMPREHENSIVE METABOLIC PANEL
AST: 22 U/L (ref 0–37)
Albumin: 4 g/dL (ref 3.5–5.2)
Calcium: 9.8 mg/dL (ref 8.4–10.5)
Creatinine, Ser: 0.85 mg/dL (ref 0.4–1.2)
GFR calc Af Amer: >60 mL/min (ref >60)
Total Protein: 6.2 g/dL (ref 6.0–8.3)

## 2010-05-02 LAB — CBC
MCH: 24 pg — ABNORMAL LOW (ref 26.0–34.0)
MCH: 22.7 pg — ABNORMAL LOW (ref 26.0–34.0)
MCHC: 30.6 g/dL (ref 30.0–36.0)
MCV: 77.1 fL — ABNORMAL LOW (ref 78.0–100.0)
Platelets: 161 10*3/uL (ref 150–400)
Platelets: 150 10*3/uL (ref 150–400)
RBC: 4.88 MIL/uL (ref 3.87–5.11)
RDW: 17 % — ABNORMAL HIGH (ref 11.5–15.5)
RDW: 14.3 % (ref 11.5–15.5)
WBC: 2.5 10*3/uL — ABNORMAL LOW (ref 4.0–10.5)

## 2010-05-02 LAB — URINE CULTURE
Colony Count: 40000
Culture  Setup Time: 201112220526

## 2010-05-02 LAB — URINALYSIS, ROUTINE W REFLEX MICROSCOPIC
Bilirubin Urine: NEGATIVE
Glucose, UA: NEGATIVE mg/dL
Ketones, ur: NEGATIVE mg/dL
Protein, ur: NEGATIVE mg/dL

## 2010-05-02 LAB — PROTIME-INR
INR: 1.8 — ABNORMAL HIGH (ref 0.00–1.49)
INR: 2 — ABNORMAL HIGH (ref 0.00–1.49)
Prothrombin Time: 21.1 seconds — ABNORMAL HIGH (ref 11.6–15.2)

## 2010-05-03 LAB — GLUCOSE, CAPILLARY
Glucose-Capillary: 141 mg/dL — ABNORMAL HIGH (ref 70–99)
Glucose-Capillary: 170 mg/dL — ABNORMAL HIGH (ref 70–99)

## 2010-05-03 LAB — DIFFERENTIAL
Basophils Relative: 1 % (ref 0–1)
Eosinophils Relative: 2 % (ref 0–5)
Lymphocytes Relative: 85 % — ABNORMAL HIGH (ref 12–46)
Monocytes Absolute: 0.1 10*3/uL (ref 0.1–1.0)
Monocytes Relative: 3 % (ref 3–12)
Neutrophils Relative %: 9 % — ABNORMAL LOW (ref 43–77)

## 2010-05-03 LAB — CBC
HCT: 34.1 % — ABNORMAL LOW (ref 36.0–46.0)
Hemoglobin: 10.6 g/dL — ABNORMAL LOW (ref 12.0–15.0)
MCV: 76.8 fL — ABNORMAL LOW (ref 78.0–100.0)
RBC: 4.44 MIL/uL (ref 3.87–5.11)
WBC: 2.2 10*3/uL — ABNORMAL LOW (ref 4.0–10.5)

## 2010-05-03 LAB — PROTIME-INR: INR: 2.13 — ABNORMAL HIGH (ref 0.00–1.49)

## 2010-05-04 LAB — DIFFERENTIAL
Basophils Absolute: 0 10*3/uL (ref 0.0–0.1)
Lymphs Abs: 1.6 10*3/uL (ref 0.7–4.0)
Monocytes Absolute: 0.3 10*3/uL (ref 0.1–1.0)
Monocytes Relative: 13 % — ABNORMAL HIGH (ref 3–12)
Neutro Abs: 0.2 10*3/uL — ABNORMAL LOW (ref 1.7–7.7)

## 2010-05-04 LAB — GLUCOSE, CAPILLARY: Glucose-Capillary: 161 mg/dL — ABNORMAL HIGH (ref 70–99)

## 2010-05-04 LAB — CBC
Hemoglobin: 10.6 g/dL — ABNORMAL LOW (ref 12.0–15.0)
MCH: 23.5 pg — ABNORMAL LOW (ref 26.0–34.0)
MCHC: 30.9 g/dL (ref 30.0–36.0)
RDW: 16.9 % — ABNORMAL HIGH (ref 11.5–15.5)

## 2010-05-04 LAB — PROTIME-INR
INR: 2.24 — ABNORMAL HIGH (ref 0.00–1.49)
Prothrombin Time: 24.9 seconds — ABNORMAL HIGH (ref 11.6–15.2)

## 2010-05-05 LAB — CBC
MCHC: 30.8 g/dL (ref 30.0–36.0)
Platelets: 158 10*3/uL (ref 150–400)
RDW: 16.4 % — ABNORMAL HIGH (ref 11.5–15.5)
WBC: 1.8 10*3/uL — ABNORMAL LOW (ref 4.0–10.5)

## 2010-05-05 LAB — DIFFERENTIAL
Basophils Absolute: 0 10*3/uL (ref 0.0–0.1)
Eosinophils Absolute: 0 10*3/uL (ref 0.0–0.7)
Lymphocytes Relative: 75 % — ABNORMAL HIGH (ref 12–46)
Monocytes Absolute: 0.2 10*3/uL (ref 0.1–1.0)
Neutrophils Relative %: 15 % — ABNORMAL LOW (ref 43–77)

## 2010-05-05 LAB — GLUCOSE, CAPILLARY: Glucose-Capillary: 257 mg/dL — ABNORMAL HIGH (ref 70–99)

## 2010-05-05 LAB — PROTIME-INR: INR: 2.59 — ABNORMAL HIGH (ref 0.00–1.49)

## 2010-05-12 ENCOUNTER — Other Ambulatory Visit: Payer: Self-pay | Admitting: Oncology

## 2010-05-12 ENCOUNTER — Encounter (HOSPITAL_BASED_OUTPATIENT_CLINIC_OR_DEPARTMENT_OTHER): Payer: Medicare Other | Admitting: Oncology

## 2010-05-12 DIAGNOSIS — Z7901 Long term (current) use of anticoagulants: Secondary | ICD-10-CM

## 2010-05-12 DIAGNOSIS — Z5112 Encounter for antineoplastic immunotherapy: Secondary | ICD-10-CM

## 2010-05-12 DIAGNOSIS — Z86718 Personal history of other venous thrombosis and embolism: Secondary | ICD-10-CM

## 2010-05-12 DIAGNOSIS — C9110 Chronic lymphocytic leukemia of B-cell type not having achieved remission: Secondary | ICD-10-CM

## 2010-05-12 LAB — CBC WITH DIFFERENTIAL/PLATELET
Basophils Absolute: 0 10*3/uL (ref 0.0–0.1)
EOS%: 2.4 % (ref 0.0–7.0)
Eosinophils Absolute: 0.1 10*3/uL (ref 0.0–0.5)
HCT: 34.3 % — ABNORMAL LOW (ref 34.8–46.6)
HGB: 10.9 g/dL — ABNORMAL LOW (ref 11.6–15.9)
MCH: 23.6 pg — ABNORMAL LOW (ref 25.1–34.0)
MCV: 74.2 fL — ABNORMAL LOW (ref 79.5–101.0)
NEUT#: 0.7 10*3/uL — ABNORMAL LOW (ref 1.5–6.5)
NEUT%: 17.4 % — ABNORMAL LOW (ref 38.4–76.8)
RDW: 16.4 % — ABNORMAL HIGH (ref 11.2–14.5)
lymph#: 3 10*3/uL (ref 0.9–3.3)

## 2010-05-12 LAB — COMPREHENSIVE METABOLIC PANEL
ALT: 23 U/L (ref 0–35)
Albumin: 3.6 g/dL (ref 3.5–5.2)
CO2: 22 mEq/L (ref 19–32)
Calcium: 9.1 mg/dL (ref 8.4–10.5)
Chloride: 98 mEq/L (ref 96–112)
Glucose, Bld: 214 mg/dL — ABNORMAL HIGH (ref 70–99)
Sodium: 133 mEq/L — ABNORMAL LOW (ref 135–145)
Total Protein: 5.5 g/dL — ABNORMAL LOW (ref 6.0–8.3)

## 2010-05-12 LAB — TECHNOLOGIST REVIEW

## 2010-05-12 LAB — PROTIME-INR: INR: 4.2 — ABNORMAL HIGH (ref 2.00–3.50)

## 2010-05-12 LAB — URIC ACID: Uric Acid, Serum: 5 mg/dL (ref 2.4–7.0)

## 2010-05-12 LAB — LACTATE DEHYDROGENASE: LDH: 284 U/L — ABNORMAL HIGH (ref 94–250)

## 2010-05-15 ENCOUNTER — Emergency Department (HOSPITAL_COMMUNITY)
Admission: EM | Admit: 2010-05-15 | Discharge: 2010-05-15 | Disposition: A | Discharge status: Home / Self Care | Payer: Medicare Other | Attending: Emergency Medicine | Admitting: Emergency Medicine

## 2010-05-15 ENCOUNTER — Emergency Department (HOSPITAL_COMMUNITY): Payer: Medicare Other

## 2010-05-15 DIAGNOSIS — Z86718 Personal history of other venous thrombosis and embolism: Secondary | ICD-10-CM | POA: Insufficient documentation

## 2010-05-15 DIAGNOSIS — J40 Bronchitis, not specified as acute or chronic: Secondary | ICD-10-CM | POA: Insufficient documentation

## 2010-05-15 DIAGNOSIS — Z79899 Other long term (current) drug therapy: Secondary | ICD-10-CM | POA: Insufficient documentation

## 2010-05-15 DIAGNOSIS — E119 Type 2 diabetes mellitus without complications: Secondary | ICD-10-CM | POA: Insufficient documentation

## 2010-05-15 DIAGNOSIS — I498 Other specified cardiac arrhythmias: Secondary | ICD-10-CM | POA: Insufficient documentation

## 2010-05-15 DIAGNOSIS — C9110 Chronic lymphocytic leukemia of B-cell type not having achieved remission: Secondary | ICD-10-CM | POA: Insufficient documentation

## 2010-05-15 DIAGNOSIS — R0602 Shortness of breath: Secondary | ICD-10-CM | POA: Insufficient documentation

## 2010-05-15 LAB — POCT I-STAT, CHEM 8
Calcium, Ion: 1.28 mmol/L (ref 1.12–1.32)
Creatinine, Ser: 0.9 mg/dL (ref 0.4–1.2)
Glucose, Bld: 119 mg/dL — ABNORMAL HIGH (ref 70–99)
HCT: 36 % (ref 36.0–46.0)
Hemoglobin: 12.2 g/dL (ref 12.0–15.0)
Potassium: 3.7 mEq/L (ref 3.5–5.1)
TCO2: 27 mmol/L (ref 0–100)

## 2010-05-15 LAB — DIFFERENTIAL
Basophils Relative: 1 % (ref 0–1)
Eosinophils Absolute: 0 10*3/uL (ref 0.0–0.7)
Lymphocytes Relative: 72 % — ABNORMAL HIGH (ref 12–46)
Lymphs Abs: 3 10*3/uL (ref 0.7–4.0)
Neutro Abs: 0.5 10*3/uL — ABNORMAL LOW (ref 1.7–7.7)

## 2010-05-15 LAB — CBC
MCH: 23.4 pg — ABNORMAL LOW (ref 26.0–34.0)
MCHC: 30.6 g/dL (ref 30.0–36.0)
MCV: 76.6 fL — ABNORMAL LOW (ref 78.0–100.0)
Platelets: 203 10*3/uL (ref 150–400)
RBC: 4.65 MIL/uL (ref 3.87–5.11)

## 2010-05-15 MED ORDER — IOHEXOL 300 MG/ML  SOLN
100.0000 mL | Freq: Once | INTRAMUSCULAR | Status: AC | PRN
  Administered 2010-05-15: 100 mL via INTRAVENOUS

## 2010-05-16 LAB — PATHOLOGIST SMEAR REVIEW

## 2010-05-17 LAB — POCT CARDIAC MARKERS
CKMB, poc: <1 ng/mL — ABNORMAL LOW (ref 1.0–8.0)
Troponin i, poc: <0.05 ng/mL (ref 0.00–0.09)

## 2010-05-23 ENCOUNTER — Other Ambulatory Visit: Payer: Self-pay | Admitting: Oncology

## 2010-05-23 ENCOUNTER — Encounter (HOSPITAL_BASED_OUTPATIENT_CLINIC_OR_DEPARTMENT_OTHER): Payer: Medicare Other | Admitting: Oncology

## 2010-05-23 ENCOUNTER — Inpatient Hospital Stay (HOSPITAL_COMMUNITY)
Admission: AD | Admit: 2010-05-23 | Discharge: 2010-05-26 | DRG: 847 | Disposition: A | Discharge status: Home / Self Care | Payer: Medicare Other | Source: Ambulatory Visit | Attending: Oncology | Admitting: Oncology

## 2010-05-23 DIAGNOSIS — C9110 Chronic lymphocytic leukemia of B-cell type not having achieved remission: Secondary | ICD-10-CM | POA: Diagnosis present

## 2010-05-23 DIAGNOSIS — Z7901 Long term (current) use of anticoagulants: Secondary | ICD-10-CM

## 2010-05-23 DIAGNOSIS — I82509 Chronic embolism and thrombosis of unspecified deep veins of unspecified lower extremity: Secondary | ICD-10-CM | POA: Diagnosis present

## 2010-05-23 DIAGNOSIS — Z5111 Encounter for antineoplastic chemotherapy: Principal | ICD-10-CM

## 2010-05-23 DIAGNOSIS — T380X5A Adverse effect of glucocorticoids and synthetic analogues, initial encounter: Secondary | ICD-10-CM | POA: Diagnosis present

## 2010-05-23 DIAGNOSIS — I10 Essential (primary) hypertension: Secondary | ICD-10-CM | POA: Diagnosis present

## 2010-05-23 DIAGNOSIS — R7309 Other abnormal glucose: Secondary | ICD-10-CM | POA: Diagnosis present

## 2010-05-23 DIAGNOSIS — Z5181 Encounter for therapeutic drug level monitoring: Secondary | ICD-10-CM

## 2010-05-23 LAB — MORPHOLOGY: PLT EST: ADEQUATE

## 2010-05-23 LAB — COMPREHENSIVE METABOLIC PANEL
ALT: 26 U/L (ref 0–35)
AST: 26 U/L (ref 0–37)
Albumin: 3.1 g/dL — ABNORMAL LOW (ref 3.5–5.2)
Alkaline Phosphatase: 231 U/L — ABNORMAL HIGH (ref 39–117)
BUN: 9 mg/dL (ref 6–23)
CO2: 24 mEq/L (ref 19–32)
Calcium: 9.8 mg/dL (ref 8.4–10.5)
Chloride: 103 mEq/L (ref 96–112)
Creatinine, Ser: 0.89 mg/dL (ref 0.4–1.2)
GFR calc Af Amer: >60 mL/min (ref >60)
GFR calc non Af Amer: >60 mL/min (ref >60)
Glucose, Bld: 200 mg/dL — ABNORMAL HIGH (ref 70–99)
Potassium: 3.5 mEq/L (ref 3.5–5.1)
Sodium: 134 mEq/L — ABNORMAL LOW (ref 135–145)
Total Bilirubin: 0.8 mg/dL (ref 0.3–1.2)
Total Protein: 5.8 g/dL — ABNORMAL LOW (ref 6.0–8.3)

## 2010-05-23 LAB — PROTIME-INR
INR: 2.9 (ref 2.00–3.50)
Protime: 34.8 Seconds — ABNORMAL HIGH (ref 10.6–13.4)

## 2010-05-23 LAB — CBC WITH DIFFERENTIAL/PLATELET
BASO%: 0.6 % (ref 0.0–2.0)
EOS%: 2.6 % (ref 0.0–7.0)
HGB: 10.8 g/dL — ABNORMAL LOW (ref 11.6–15.9)
MCH: 23.3 pg — ABNORMAL LOW (ref 25.1–34.0)
MCHC: 31.9 g/dL (ref 31.5–36.0)
MONO#: 0.4 10*3/uL (ref 0.1–0.9)
RDW: 16.3 % — ABNORMAL HIGH (ref 11.2–14.5)
WBC: 3.5 10*3/uL — ABNORMAL LOW (ref 3.9–10.3)
lymph#: 2.6 10*3/uL (ref 0.9–3.3)

## 2010-05-23 LAB — GLUCOSE, CAPILLARY
Glucose-Capillary: 136 mg/dL — ABNORMAL HIGH (ref 70–99)
Glucose-Capillary: 300 mg/dL — ABNORMAL HIGH (ref 70–99)

## 2010-05-23 LAB — LACTATE DEHYDROGENASE: LDH: 292 U/L — ABNORMAL HIGH (ref 94–250)

## 2010-05-24 ENCOUNTER — Inpatient Hospital Stay (HOSPITAL_COMMUNITY): Payer: Medicare Other

## 2010-05-24 LAB — DIFFERENTIAL
Basophils Absolute: 0 10*3/uL (ref 0.0–0.1)
Basophils Absolute: 0 K/uL (ref 0.0–0.1)
Basophils Relative: 0 % (ref 0–1)
Eosinophils Absolute: 0 10*3/uL (ref 0.0–0.7)
Eosinophils Absolute: 0.1 10*3/uL (ref 0.0–0.7)
Eosinophils Relative: 2 % (ref 0–5)
Lymphocytes Relative: 72 % — ABNORMAL HIGH (ref 12–46)
Lymphocytes Relative: 74 % — ABNORMAL HIGH (ref 12–46)
Lymphs Abs: 3.3 10*3/uL (ref 0.7–4.0)
Monocytes Absolute: 0.3 10*3/uL (ref 0.1–1.0)
Monocytes Absolute: 0.3 10*3/uL (ref 0.1–1.0)
Monocytes Relative: 7 % (ref 3–12)
Neutro Abs: 0.4 10*3/uL — ABNORMAL LOW (ref 1.7–7.7)
Neutro Abs: 0.7 K/uL — ABNORMAL LOW (ref 1.7–7.7)
Neutrophils Relative %: 17 % — ABNORMAL LOW (ref 43–77)

## 2010-05-24 LAB — PATHOLOGIST SMEAR REVIEW

## 2010-05-24 LAB — GLUCOSE, CAPILLARY
Glucose-Capillary: 110 mg/dL — ABNORMAL HIGH (ref 70–99)
Glucose-Capillary: 129 mg/dL — ABNORMAL HIGH (ref 70–99)
Glucose-Capillary: 141 mg/dL — ABNORMAL HIGH (ref 70–99)
Glucose-Capillary: 149 mg/dL — ABNORMAL HIGH (ref 70–99)
Glucose-Capillary: 151 mg/dL — ABNORMAL HIGH (ref 70–99)
Glucose-Capillary: 312 mg/dL — ABNORMAL HIGH (ref 70–99)

## 2010-05-24 LAB — CBC
HCT: 33.9 % — ABNORMAL LOW (ref 36.0–46.0)
HCT: 38.7 % (ref 36.0–46.0)
Hemoglobin: 11.9 g/dL — ABNORMAL LOW (ref 12.0–15.0)
MCH: 23.2 pg — ABNORMAL LOW (ref 26.0–34.0)
MCHC: 30.9 g/dL (ref 30.0–36.0)
MCHC: 30.7 g/dL (ref 30.0–36.0)
MCHC: 31.3 g/dL (ref 30.0–36.0)
MCV: 73.7 fL — ABNORMAL LOW (ref 78.0–100.0)
MCV: 74 fL — ABNORMAL LOW (ref 78.0–100.0)
Platelets: 164 10*3/uL (ref 150–400)
Platelets: 140 10*3/uL — ABNORMAL LOW (ref 150–400)
Platelets: 146 10*3/uL — ABNORMAL LOW (ref 150–400)
Platelets: 170 K/uL (ref 150–400)
RBC: 4.19 MIL/uL (ref 3.87–5.11)
RBC: 4.52 MIL/uL (ref 3.87–5.11)
RBC: 5.25 MIL/uL — ABNORMAL HIGH (ref 3.87–5.11)
RDW: 15.9 % — ABNORMAL HIGH (ref 11.5–15.5)
RDW: 15 % (ref 11.5–15.5)
RDW: 15.4 % (ref 11.5–15.5)
WBC: 4.1 10*3/uL (ref 4.0–10.5)
WBC: 4.4 10*3/uL (ref 4.0–10.5)

## 2010-05-24 LAB — POCT CARDIAC MARKERS
CKMB, poc: <1 ng/mL — ABNORMAL LOW (ref 1.0–8.0)
Myoglobin, poc: 86.6 ng/mL (ref 12–200)
Troponin i, poc: <0.05 ng/mL (ref 0.00–0.09)

## 2010-05-24 LAB — CARDIAC PANEL(CRET KIN+CKTOT+MB+TROPI)
CK, MB: 1.1 ng/mL (ref 0.3–4.0)
CK, MB: 1.3 ng/mL (ref 0.3–4.0)
Relative Index: INVALID (ref 0.0–2.5)
Relative Index: INVALID (ref 0.0–2.5)
Relative Index: INVALID (ref 0.0–2.5)
Total CK: 37 U/L (ref 7–177)
Total CK: 41 U/L (ref 7–177)
Troponin I: 0.01 ng/mL (ref 0.00–0.06)
Troponin I: 0.02 ng/mL (ref 0.00–0.06)
Troponin I: 0.02 ng/mL (ref 0.00–0.06)

## 2010-05-24 LAB — BASIC METABOLIC PANEL
BUN: 7 mg/dL (ref 6–23)
Calcium: 9.1 mg/dL (ref 8.4–10.5)
Chloride: 106 mEq/L (ref 96–112)
Creatinine, Ser: 0.58 mg/dL (ref 0.4–1.2)
GFR calc Af Amer: >60 mL/min (ref >60)
GFR calc non Af Amer: >60 mL/min (ref >60)

## 2010-05-24 LAB — BLOOD GAS, VENOUS
Acid-Base Excess: 3.3 mmol/L — ABNORMAL HIGH (ref 0.0–2.0)
Bicarbonate: 27.6 mEq/L — ABNORMAL HIGH (ref 20.0–24.0)
O2 Saturation: 40.4 %
Patient temperature: 98.6
TCO2: 25.3 mmol/L (ref 0–100)
pCO2, Ven: 43.1 mmHg — ABNORMAL LOW (ref 45.0–50.0)
pH, Ven: 7.423 — ABNORMAL HIGH (ref 7.250–7.300)
pO2, Ven: 24.7 mmHg — CL (ref 30.0–45.0)

## 2010-05-24 LAB — RETICULOCYTES
RBC.: 4.73 MIL/uL (ref 3.87–5.11)
Retic Count, Absolute: 89.9 10*3/uL (ref 19.0–186.0)
Retic Ct Pct: 1.9 % (ref 0.4–3.1)

## 2010-05-24 LAB — COMPREHENSIVE METABOLIC PANEL
ALT: 14 U/L (ref 0–35)
Alkaline Phosphatase: 206 U/L — ABNORMAL HIGH (ref 39–117)
BUN: 8 mg/dL (ref 6–23)
Chloride: 105 mEq/L (ref 96–112)
Glucose, Bld: 124 mg/dL — ABNORMAL HIGH (ref 70–99)
Potassium: 3.7 mEq/L (ref 3.5–5.1)
Sodium: 139 mEq/L (ref 135–145)
Total Bilirubin: 0.6 mg/dL (ref 0.3–1.2)

## 2010-05-24 LAB — PROTIME-INR
INR: 1.02 (ref 0.00–1.49)
INR: 1.15 (ref 0.00–1.49)
INR: 1.36 (ref 0.00–1.49)
Prothrombin Time: 28.7 seconds — ABNORMAL HIGH (ref 11.6–15.2)
Prothrombin Time: 13.3 seconds (ref 11.6–15.2)
Prothrombin Time: 14.6 seconds (ref 11.6–15.2)
Prothrombin Time: 16.7 seconds — ABNORMAL HIGH (ref 11.6–15.2)

## 2010-05-24 LAB — COMPREHENSIVE METABOLIC PANEL WITH GFR
AST: 17 U/L (ref 0–37)
Albumin: 3.9 g/dL (ref 3.5–5.2)
CO2: 25 meq/L (ref 19–32)
Calcium: 9.9 mg/dL (ref 8.4–10.5)
Creatinine, Ser: 0.88 mg/dL (ref 0.4–1.2)
GFR calc Af Amer: >60 mL/min (ref >60)
GFR calc non Af Amer: >60 mL/min (ref >60)
Total Protein: 6.6 g/dL (ref 6.0–8.3)

## 2010-05-24 LAB — IRON AND TIBC
Iron: 73 ug/dL (ref 42–135)
Saturation Ratios: 22 % (ref 20–55)
TIBC: 339 ug/dL (ref 250–470)
UIBC: 266 ug/dL

## 2010-05-24 LAB — HEMOGLOBIN A1C
Hgb A1c MFr Bld: 6.9 % — ABNORMAL HIGH (ref 4.6–6.1)
Mean Plasma Glucose: 151 mg/dL

## 2010-05-24 LAB — PHOSPHORUS: Phosphorus: 3.5 mg/dL (ref 2.3–4.6)

## 2010-05-24 LAB — FERRITIN: Ferritin: 61 ng/mL (ref 10–291)

## 2010-05-24 LAB — VITAMIN B12: Vitamin B-12: 363 pg/mL (ref 211–911)

## 2010-05-24 LAB — BRAIN NATRIURETIC PEPTIDE: Pro B Natriuretic peptide (BNP): <30 pg/mL (ref 0.0–100.0)

## 2010-05-24 LAB — APTT: aPTT: 22 s — ABNORMAL LOW (ref 24–37)

## 2010-05-25 LAB — CBC
MCH: 23.3 pg — ABNORMAL LOW (ref 26.0–34.0)
MCV: 74.4 fL — ABNORMAL LOW (ref 78.0–100.0)
Platelets: 188 10*3/uL (ref 150–400)
RBC: 3.87 MIL/uL (ref 3.87–5.11)
RDW: 16 % — ABNORMAL HIGH (ref 11.5–15.5)
WBC: 3.6 10*3/uL — ABNORMAL LOW (ref 4.0–10.5)

## 2010-05-25 LAB — DIFFERENTIAL
Basophils Relative: 0 % (ref 0–1)
Eosinophils Absolute: 0 10*3/uL (ref 0.0–0.7)
Eosinophils Relative: 0 % (ref 0–5)
Lymphs Abs: 2.4 10*3/uL (ref 0.7–4.0)
Monocytes Absolute: 0.5 10*3/uL (ref 0.1–1.0)
Neutrophils Relative %: 19 % — ABNORMAL LOW (ref 43–77)

## 2010-05-25 LAB — BASIC METABOLIC PANEL
BUN: 11 mg/dL (ref 6–23)
Chloride: 109 mEq/L (ref 96–112)
Creatinine, Ser: 0.82 mg/dL (ref 0.4–1.2)
GFR calc Af Amer: >60 mL/min (ref >60)
GFR calc non Af Amer: >60 mL/min (ref >60)

## 2010-05-25 LAB — PROTIME-INR
INR: 3.85 — ABNORMAL HIGH (ref 0.00–1.49)
Prothrombin Time: 37.8 seconds — ABNORMAL HIGH (ref 11.6–15.2)

## 2010-05-25 LAB — GLUCOSE, CAPILLARY: Glucose-Capillary: 326 mg/dL — ABNORMAL HIGH (ref 70–99)

## 2010-05-26 LAB — DIFFERENTIAL
Basophils Absolute: 0 10*3/uL (ref 0.0–0.1)
Eosinophils Relative: 0 % (ref 0–5)
Lymphocytes Relative: 70 % — ABNORMAL HIGH (ref 12–46)
Lymphs Abs: 2.7 10*3/uL (ref 0.7–4.0)
Monocytes Relative: 14 % — ABNORMAL HIGH (ref 3–12)
Neutrophils Relative %: 16 % — ABNORMAL LOW (ref 43–77)

## 2010-05-26 LAB — CBC
HCT: 27.6 % — ABNORMAL LOW (ref 36.0–46.0)
Hemoglobin: 8.6 g/dL — ABNORMAL LOW (ref 12.0–15.0)
MCV: 74.6 fL — ABNORMAL LOW (ref 78.0–100.0)
RBC: 3.7 MIL/uL — ABNORMAL LOW (ref 3.87–5.11)
RDW: 16.2 % — ABNORMAL HIGH (ref 11.5–15.5)
WBC: 3.8 10*3/uL — ABNORMAL LOW (ref 4.0–10.5)

## 2010-05-26 LAB — GLUCOSE, CAPILLARY
Glucose-Capillary: 379 mg/dL — ABNORMAL HIGH (ref 70–99)
Glucose-Capillary: 310 mg/dL — ABNORMAL HIGH (ref 70–99)

## 2010-05-26 LAB — PROTIME-INR: INR: 5.73 (ref 0.00–1.49)

## 2010-05-26 NOTE — H&P (Signed)
NAMESURAH, [name]                 ACCOUNT NO.:  [id number]  MEDICAL RECORD NO.:  [id number]           PATIENT TYPE:  I  LOCATION:  1337                         FACILITY:  [facility]  PHYSICIAN:  Genene Churn. Cyndie Chime, M.D., F.A.C.P.     DATE OF BIRTH:  1940/09/03  DATE OF ADMISSION:  05/23/2010 DATE OF DISCHARGE:                             HISTORY & PHYSICAL   HISTORY OF PRESENT ILLNESS:  [name] is a 70 year old woman with long- standing CLL.  Initial diagnosis dates to December 2001.  She has had partial remissions to multiple different treatments.  She was initially treated with fludarabine with a 62-month response.  She was re-treated with fludarabine again.  She was treated with Campath anti-CD52 antibodies September 2006 through December 2006 and then again February 2008 through March 2008.  She completed 6 cycles of Treanda between October 2008 and April 2009.  She was treated with Arzerra June 2010 through November 2010.  She was treated with a combination of both the Chad between May 2011 and November 18, 2009.  With each treatment remission, durations have been shorter.  [name] recently developed progressive constitutional symptoms of fever, sweats, and anorexia.  She was referred to Dr. Latanya Presser at [facility].  Dr. Leonard Downing recommended salvage treatment with high-dose steroids plus Rituxan.  She was admitted to [facility] on April 18, 2010.  She received the first course of Solu-Medrol 1 g/m2 daily for 3 doses and a dose of Rituxan 375 mg/m2.  She tolerated the treatment well.  She was discharged home on Friday April 22, 2010. Of note, she had an anaphylactoid reaction to Rituxan in the past when it was given many years ago.  [name] was admitted to [facility] April 25, 2010, with a nonproductive cough, fever, generalized weakness, and urinary incontinence.  She was diagnosed with influenza A pneumonitis.  She received IV  antibiotics as well as a course of Tamiflu.  The respiratory symptoms slowly improved.  During that hospitalization, she received the second dose of Rituxan and steroids on May 04, 2010.  She again tolerated the Rituxan without significant acute toxicity.  [name] received the third treatment with Rituxan 375 mg/m2 and Solu- Medrol 1000 mg IV on May 12, 2010, as an outpatient.  She presents today for elective admission for Rituxan and high-dose steroids.  PAST MEDICAL HISTORY: 1. CLL as outlined above. 2. Left lower extremity DVT, November 2002. 3. Acute pulmonary embolus, December 2010. 4. Advanced degenerative arthritis. 5. Morbid obesity. 6. Hypertension. 7. Steroid-induced diabetes. 8. Recent influenza A pneumonitis.  MEDICATIONS: 1. Enablex 7.5 mg daily. 2. Coumadin 6 mg daily. 3. Tylenol 650 mg every 4 hours as needed. 4. Enteric-coated aspirin 81 mg daily. 5. Benadryl 25 mg every 6 hours as needed. 6. Combivent inhaler 2 puffs 4 times daily as needed. 7. Fluconazole 100 mg daily. 8. Sliding scale insulin. 9. Protonix 40 mg daily. 10.Percocet every 8 hours as needed. 11.Septra DS 1 tablet q. Monday, Wednesday, Friday. 12.Valtrex 500 mg daily. 13.Vitamin D2 50,000 units every Monday, Wednesday, and Friday. 14.Nepafenac  0.1% ophthalmic suspension 1 drop in the right eye 4     times daily.  ALLERGIES:  No known drug allergies.  FAMILY HISTORY:  Noncontributory.  SOCIAL HISTORY:  [name] lives in [place].  She is a widow.  Her husband died last year with lung cancer.  She has 2 sons and 2 daughters.  No alcohol or tobacco use.  REVIEW OF SYSTEMS:  Continued intermittent fevers and sweats.  Poor appetite.  Malaise.  She is spending a significant portion of the day in bed.  Her cough is better.  She continues to note dyspnea on exertion. She denies any chest pain.  No GI complaints.  PHYSICAL EXAMINATION:  VITAL SIGNS:  Temperature 98.4, heart rate  109, respirations 20, blood pressure 109/69, weight 250 pounds, height 63 inches.  GENERAL:  Pleasant female in no acute distress. HEENT:  Oropharynx is without thrush or ulceration. LYMPH:  All previous areas of adenopathy have regressed. CHEST:  Rales at the right lung base. CARDIOVASCULAR:  Regular rate and rhythm.  No murmur. ABDOMEN:  Soft.  She is tender at the left upper quadrant.  This is a chronic finding.  No organomegaly. EXTREMITIES:  1+ pedal edema.  Port-A-Cath at the right proximal arm, nontender and without erythema. NEURO:  Alert and oriented.  Motor strength 5/5.  LABORATORY DATA:  Hemoglobin 10.8, white count 3.5, absolute neutrophil count 0.4, platelet count 154,000.  PT 34.8, INR 2.9.  IMPRESSION AND PLAN: 1. Refractory chronic lymphocytic leukemia, heavily treated, recently     started on a salvage regimen with Rituxan and high-dose steroids. 2. Recent influenza A pneumonitis. 3. History of deep venous thrombosis/pulmonary embolism, on chronic     Coumadin anticoagulation. 4. Steroid-induced diabetes. 5. Morbid obesity. 6. Hypertension.  [name] is being admitted for the second cycle of high-dose steroids and Rituxan for refractory CLL.  The patient was interviewed and examined by Dr. Cyndie Chime; plan per Dr. Cyndie Chime.     Marie Cobb, NP   ______________________________ Genene Churn. Cyndie Chime, M.D., F.A.C.P.    LT/MEDQ  D:  05/23/2010  T:  05/24/2010  Job:  [id number]  cc:   Lucky Cowboy, M.D. Fax: [phone]  Dr. Latanya Presser Department of Hematology/Oncology [facility]  Electronically Signed by [name] N.P. on 05/25/2010 01:44:47 PM Electronically Signed by Cephas Darby M.D. on 05/26/2010 12:22:49 PM

## 2010-05-26 NOTE — Discharge Summary (Signed)
[name], [name]                 ACCOUNT NO.:  [id number]  MEDICAL RECORD NO.:  [id number]           PATIENT TYPE:  I  LOCATION:  1307                         FACILITY:  [facility]  PHYSICIAN:  Genene Churn. Cyndie Chime, M.D., F.A.C.P.     DATE OF BIRTH:  1940/11/28  DATE OF ADMISSION:  04/25/2010 DATE OF DISCHARGE:  05/05/2010                              DISCHARGE SUMMARY   REASON FOR HOSPITALIZATION:  Cough, fever, and neutropenia in patient with chronic lymphocytic leukemia.  HISTORY OF PRESENT ILLNESS:  [name] is a 70 year old woman with longstanding chronic lymphocytic leukemia.  Initial diagnosis dates to December 2001.  She has had partial remissions to multiple different treatments.  She was initially treated with fludarabine with a 30-month response.  She was retreated with fludarabine again.  She received CAMPATH anti-CD52 antibody, September 2006 through December 2006, and then again February 2008 through March 2008.  She completed 6 cycles of Treanda between, October 2008 and April 2009.  She was treated with Suzan Nailer, June 2010 through November 2010.  She was treated with a combination of both  Treanda plus Arzerra between May 2011 and November 18, 2009.  With each treatment remission durations have been shorter.  For the past 2-3 weeks prior to admission, she had developed progressive constitutional symptoms with fever, sweats, and anorexia.  She was seen  by Dr. Latanya Presser at [facility].  There were no current clinical trials that she would be eligible for.  Dr. Leonard Downing recommended salvage treatment with high-dose steroids plus Rituxan.  She was admitted to [facility] on April 18, 2010.  She received the first course of Solu-Medrol 1 g/m2 daily for 3 doses and a dose of Rituxan 375 mg/m2.  She tolerated the treatment well.  Of note, she had had an anaphylactoid reaction to Rituxan in the past when  given many years ago.  She was discharged home on  Friday, April 22, 2010.  [name] contacted the office on April 26, 2010, to report a progressive hacking nonproductive cough, fever to 101.6 degrees, generalized weakness, and urinary incontinence.  She appeared acutely ill and was subsequently admitted for further evaluation and treatment.  HOSPITAL COURSE:  [name] was admitted to the oncology unit at [facility].  Two sets of blood cultures, a urine culture and chest x- ray were obtained.  IV antibiotics were initiated with Elita Quick and azithromycin.  The chest x-ray showed low lung volumes with decreased aeration of both lung bases.  The blood cultures and urine culture remained negative.  Nasal swab for influenza A and B was obtained.  The swab returned positive for influenza A/PCR.  The influenza B/PCR was negative.  She was started on a course of Tamiflu.  Elita Quick was discontinued on April 28, 2010, and azithromycin was discontinued on April 30, 2010.  Followup chest x-ray on April 29, 2010, showed low lung volumes without convincing evidence of acute disease.  The cough slowly improved.  She continued to have intermittent low-grade fevers.  She did, however, remained afebrile for greater than 48 hours prior to discharge.  Ms.  [name] complained of left upper quadrant pain during the hospitalization.  The pain was felt to likely be secondary to splenomegaly/persistent cough with a musculoskeletal component. Abdominal ultrasound on April 27, 2010, showed a normal-appearing spleen. There were no visible intrasplenic abnormalities sonographically.  [name] has a history of a left lower extremity DVT dating to November 2002.  She was on long-term anticoagulation.  In December 2010, after being off anticoagulation for a few months, she had an acute pulmonary embolus and anticoagulation was resumed.  During the hospitalization, the INR became subtherapeutic.  Lovenox was initiated while the Coumadin dose was titrated.  Once the  INR was at a stable therapeutic level, the Lovenox was discontinued.  INR at discharge on May 05, 2010, was 2.59. She was discharged home on Coumadin at a dose of 8 mg daily.  [name]'s hemoglobin declined to a low of 7.6 on April 28, 2010.  She was transfused 2 units of blood on April 29, 2010.  Following the blood transfusion, her hemoglobin remained stable in the 10 range.  At discharge, her hemoglobin was 10.2.  When she was otherwise stable, the decision was made to proceed with the 2nd dose of Rituxan.  She received Solu-Medrol 500 mg/m2 1000 mg IV, Tylenol 650 mg by mouth, Benadryl 50 mg IV, and Pepcid 20 mg IV prior to Rituxan 375 mg/m2 for a total dose of 860 mg IV over 6 hours on May 04, 2010.  She tolerated the Rituxan without significant acute toxicity.  [name] was experiencing urinary incontinence upon admission.  The urinary incontinence improved as her cough improved as well as with a trial of Enablex.  Dr. Cyndie Chime discussed end-of-life issues with [name] during the hospitalization.  She indicated that she would not want prolonged life support in the event of irreversible deterioration in her condition.  On May 05, 2010, [name] was felt to be stable for discharge home.  CONSULTATIONS:  None.  PROCEDURES: 1. Transfusion of 2 units of packed red blood cells, April 29, 2010. 2. Rituxan/IV Solu-Medrol, May 04, 2010.  DISCHARGING DIAGNOSES: 1. Influenza A pneumonitis in an immunocompromised host. 2. Refractory chronic lymphocytic leukemia. 3. History of deep vein thrombosis/pulmonary embolism, on chronic     anticoagulation. 4. Steroid-induced diabetes. 5. Morbid obesity. 6. Urinary incontinence, improved with improvement in her cough and a     trial of Enablex  DISCHARGE DISPOSITION: 1. Condition - stable for discharge home. 2. Activity - increase activity slowly. 3. Diet - no restrictions. 4. Wound care - routine Port-A-Cath  care.  FOLLOWUP: 1. Dr. Cyndie Chime, as scheduled. 2. Cancer center anticoagulation clinic.  DISCHARGE MEDICATIONS: 1. Enablex 7.5 mg daily. 2. Coumadin 8 mg daily. 3. Tylenol 650 mg every 4 hours as needed. 4. Enteric-coated aspirin 81 mg q.a.m. 5. Benadryl 25 mg every 6 hours as needed for insomnia or itching. 6. Combivent inhaler 2 puffs 4 times daily as needed. 7. Fluconazole 100 mg daily. 8. Sliding scale insulin for CBG less than 150 no insulin, 151 to 200,     201-250 two units, 251-300 four units, 301-350 six units, 351-400     eight units, 400+ give ten units and call physician. 9. Protonix 40 mg daily. 10.Percocet 1 tablet every 8 hours as needed. 11.Septra DS 1 tablet Monday, Wednesdays, and Fridays. 12.Valtrex 500 mg daily. 13.Vitamin B2 50,000 units every Monday, Wednesday, and Friday. 14.Nepafenac 0.1% ophthalmic suspension 1 drop in the right eye 4     times daily.  Lonna Cobb, NP   ______________________________ Genene Churn. Cyndie Chime, M.D., F.A.C.P.    LT/MEDQ  D:  05/09/2010  T:  05/09/2010  Job:  [id number]  cc:   Dr. Latanya Presser Department of Hematology/Oncology, [facility]  Lucky Cowboy, M.D. Fax: [phone]  Electronically Signed by Lonna Cobb N.P. on 05/25/2010 01:43:09 PM Electronically Signed by Cephas Darby M.D. on 05/26/2010 12:22:13 PM

## 2010-05-30 ENCOUNTER — Encounter (HOSPITAL_BASED_OUTPATIENT_CLINIC_OR_DEPARTMENT_OTHER): Payer: Medicare Other | Admitting: Oncology

## 2010-05-30 ENCOUNTER — Other Ambulatory Visit: Payer: Self-pay | Admitting: Oncology

## 2010-05-30 DIAGNOSIS — Z86718 Personal history of other venous thrombosis and embolism: Secondary | ICD-10-CM

## 2010-05-30 DIAGNOSIS — Z5112 Encounter for antineoplastic immunotherapy: Secondary | ICD-10-CM

## 2010-05-30 DIAGNOSIS — C9110 Chronic lymphocytic leukemia of B-cell type not having achieved remission: Secondary | ICD-10-CM

## 2010-05-30 DIAGNOSIS — Z7901 Long term (current) use of anticoagulants: Secondary | ICD-10-CM

## 2010-05-30 LAB — CBC WITH DIFFERENTIAL/PLATELET
BASO%: 0.2 % (ref 0.0–2.0)
EOS%: 3 % (ref 0.0–7.0)
HCT: 30.5 % — ABNORMAL LOW (ref 34.8–46.6)
MCHC: 33.1 g/dL (ref 31.5–36.0)
MONO#: 0.1 10*3/uL (ref 0.1–0.9)
NEUT%: 13 % — ABNORMAL LOW (ref 38.4–76.8)
RBC: 4.09 10*6/uL (ref 3.70–5.45)
RDW: 17.6 % — ABNORMAL HIGH (ref 11.2–14.5)
WBC: 3.9 10*3/uL (ref 3.9–10.3)
lymph#: 3.1 10*3/uL (ref 0.9–3.3)

## 2010-05-30 LAB — PROTIME-INR: INR: 2 (ref 2.00–3.50)

## 2010-06-01 ENCOUNTER — Other Ambulatory Visit: Payer: Self-pay | Admitting: Oncology

## 2010-06-01 ENCOUNTER — Encounter (HOSPITAL_BASED_OUTPATIENT_CLINIC_OR_DEPARTMENT_OTHER): Payer: Medicare Other | Admitting: Oncology

## 2010-06-01 DIAGNOSIS — Z5112 Encounter for antineoplastic immunotherapy: Secondary | ICD-10-CM

## 2010-06-01 DIAGNOSIS — Z7901 Long term (current) use of anticoagulants: Secondary | ICD-10-CM

## 2010-06-01 DIAGNOSIS — C9110 Chronic lymphocytic leukemia of B-cell type not having achieved remission: Secondary | ICD-10-CM

## 2010-06-01 DIAGNOSIS — Z86718 Personal history of other venous thrombosis and embolism: Secondary | ICD-10-CM

## 2010-06-01 LAB — CBC WITH DIFFERENTIAL/PLATELET
Basophils Absolute: 0 10*3/uL (ref 0.0–0.1)
Eosinophils Absolute: 0.1 10*3/uL (ref 0.0–0.5)
HCT: 31.5 % — ABNORMAL LOW (ref 34.8–46.6)
HGB: 10.3 g/dL — ABNORMAL LOW (ref 11.6–15.9)
MONO#: 0.1 10*3/uL (ref 0.1–0.9)
NEUT#: 0.7 10*3/uL — ABNORMAL LOW (ref 1.5–6.5)
NEUT%: 16.4 % — ABNORMAL LOW (ref 38.4–76.8)
RDW: 17.3 % — ABNORMAL HIGH (ref 11.2–14.5)
WBC: 4 10*3/uL (ref 3.9–10.3)
lymph#: 3.2 10*3/uL (ref 0.9–3.3)

## 2010-06-01 LAB — PROTIME-INR: INR: 1.8 — ABNORMAL LOW (ref 2.00–3.50)

## 2010-06-06 ENCOUNTER — Other Ambulatory Visit: Payer: Self-pay | Admitting: Oncology

## 2010-06-06 ENCOUNTER — Encounter (HOSPITAL_BASED_OUTPATIENT_CLINIC_OR_DEPARTMENT_OTHER): Payer: Medicare Other | Admitting: Oncology

## 2010-06-06 DIAGNOSIS — Z7901 Long term (current) use of anticoagulants: Secondary | ICD-10-CM

## 2010-06-06 DIAGNOSIS — Z86718 Personal history of other venous thrombosis and embolism: Secondary | ICD-10-CM

## 2010-06-06 DIAGNOSIS — Z5181 Encounter for therapeutic drug level monitoring: Secondary | ICD-10-CM

## 2010-06-06 DIAGNOSIS — C9110 Chronic lymphocytic leukemia of B-cell type not having achieved remission: Secondary | ICD-10-CM

## 2010-06-06 LAB — COMPREHENSIVE METABOLIC PANEL
ALT: 22 U/L (ref 0–35)
AST: 16 U/L (ref 0–37)
Albumin: 3.2 g/dL — ABNORMAL LOW (ref 3.5–5.2)
Alkaline Phosphatase: 200 U/L — ABNORMAL HIGH (ref 39–117)
Calcium: 9.3 mg/dL (ref 8.4–10.5)
Chloride: 97 mEq/L (ref 96–112)
Potassium: 3.8 mEq/L (ref 3.5–5.3)
Sodium: 131 mEq/L — ABNORMAL LOW (ref 135–145)
Total Protein: 4.8 g/dL — ABNORMAL LOW (ref 6.0–8.3)

## 2010-06-06 LAB — CBC & DIFF AND RETIC
BASO%: 0.6 % (ref 0.0–2.0)
EOS%: 1.6 % (ref 0.0–7.0)
HCT: 31.2 % — ABNORMAL LOW (ref 34.8–46.6)
Immature Retic Fract: 9.2 % (ref 0.00–10.70)
LYMPH%: 81.6 % — ABNORMAL HIGH (ref 14.0–49.7)
MCH: 23.3 pg — ABNORMAL LOW (ref 25.1–34.0)
MCHC: 31.7 g/dL (ref 31.5–36.0)
MONO#: 0.3 10*3/uL (ref 0.1–0.9)
NEUT%: 6.7 % — ABNORMAL LOW (ref 38.4–76.8)
RBC: 4.24 10*6/uL (ref 3.70–5.45)
Retic %: 1.54 % — ABNORMAL HIGH (ref 0.50–1.50)
WBC: 3.2 10*3/uL — ABNORMAL LOW (ref 3.9–10.3)
lymph#: 2.6 10*3/uL (ref 0.9–3.3)

## 2010-06-06 LAB — MORPHOLOGY

## 2010-06-06 LAB — PROTIME-INR: Protime: 33.6 Seconds — ABNORMAL HIGH (ref 10.6–13.4)

## 2010-06-06 LAB — CHCC SMEAR

## 2010-06-06 NOTE — Discharge Summary (Signed)
NAMEDAJANAE, BROPHY                 ACCOUNT NO.:  [id number]  MEDICAL RECORD NO.:  [id number]           PATIENT TYPE:  I  LOCATION:  1337                         FACILITY:  [facility]  PHYSICIAN:  Genene Churn. Cyndie Chime, M.D., F.A.C.P.     DATE OF BIRTH:  December 07, 1940  DATE OF ADMISSION:  05/23/2010 DATE OF DISCHARGE:  05/26/2010                              DISCHARGE SUMMARY   REASON FOR HOSPITALIZATION:  Refractory chronic lymphocytic leukemia, admitted for treatment with Rituxan and high-dose steroids.  HISTORY OF PRESENT ILLNESS:  Ms. Rison is a 70 year old woman with long- standing CLL.  Initial diagnosis dates to December 2001.  She has had partial remissions to multiple different treatments.  She was initially treated with fludarabine with a 44-month response.  She was retreated with fludarabine again.  She was treated with Campath in September 2006 through December 2006 and then again in February 2008 through March 2008.  She completed 6 cycles of Treanda between October 2008 and April 2009.  She was treated with Suzan Nailer, July 28, 2008, through November 2010.  She was treated with a combination of both Treanda and Arzerra between May 2011 and November 18, 2009.  With each treatment regimen, durations have been shorter.  Ms. Lebron recently developed progressive constitutional symptoms of fever, sweats, and anorexia.  She was referred to Dr. Leonard Downing at [facility]. Dr. Leonard Downing recommended salvage treatment with high-dose steroids plus Rituxan.  She was admitted to [facility] on April 18, 2010.  She received the first course of Solu-Medrol 1 g per meter squared daily for 3 doses and a dose of Rituxan 375 mg per meter squared.  She tolerated the treatment well.  She was discharged home on Friday on April 22, 2010.  Of note, she had an anaphylactoid reaction to Rituxan in the past when it was given many years ago.  Ms. Wiles was admitted to [facility] on April 25, 2010, with  a nonproductive cough, fever, generalized weakness, and urinary incontinence.  She was diagnosed with influenza A pneumonitis.  The respiratory symptoms improved during the course of the hospitalization. She received the 2nd dose of Rituxan and steroids on May 04, 2010. She again tolerated the Rituxan without significant acute toxicity.  Ms. Pastrana received a 3rd treatment with Rituxan 375 mg per meter squared and Solu-Medrol 1000 mg IV on May 12, 2010, as an outpatient.  Ms. Hossain presented for elective admission on May 24, 2010, for Rituxan and high-dose steroids.  HOSPITAL COURSE:  Ms.  Lipa was admitted to the oncology unit at [facility].  Admission labs showed hemoglobin 10.8, white count 3.5, absolute neutrophil count 0.4, platelet count 154,000.  Sodium 134, potassium 3.5, BUN 9, creatinine 0.89, bilirubin 0.8, alkaline phosphatase 231, SGOT 26, SGPT 26, total protein 5.8, albumin 3.1, calcium 9.8, LDH 292.  On the preadmission physical examination, all previous areas of adenopathy had regressed.  On May 23, 2010, she was premedicated with Pepcid 20 mg IV, Tylenol 650 mg p.o., Benadryl 50 mg IV, and Solu-Medrol 1 g per meter squared for a total dose of 2.1 g IV over  90 minutes followed by Rituxan 375 mg per meter squared for a total dose of 800 mg IV.  She tolerated the first dose of Solu-Medrol and the Rituxan well.  She again received the Solu- Medrol 1 g per meter squared, total dose 2.1 g IV over 90 minutes on May 24, 2010, and May 25, 2010.  Overall, she tolerated the treatment well.  She developed steroid-induced hyperglycemia with sliding scale insulin provided as needed.  Maximum capillary blood glucose was 379 on May 25, 2010.   She was discharged home on sliding scale insulin.  Ms. Swartout is maintained on chronic Coumadin anticoagulation for a history of left lower extremity DVT in November 2002 and an acute pulmonary embolus in December 2010.  The  PT/INR was followed closely. On admission, the INR was 2.69.  On May 25, 2010, the INR was 3.85 and 4.01.  The Coumadin was held.  On May 26, 2010, the INR was 5.73.  She was instructed to hold the Coumadin dose on May 26, 2010, and May 27, 2010, and to resume on May 28, 2010, at a dose of 5 mg daily.  She will come to the office on May 30, 2010, for a followup PT/INR.  Ms. Schild was felt to be stable for discharge home on May 26, 2010.  CONSULTATIONS:  None.  PROCEDURES: 1. Rituxan 375 mg per meter squared IV on May 23, 2010. 2. Solu-Medrol 1 g per meter squared IV May 23, 2010, May 24, 2010,     and May 25, 2010.  DISCHARGE DISPOSITION: 1. Condition - stable for discharge home. 2. Activity - increase activity slowly. 3. Diet - no restrictions. 4. Wound care - routine Port-a-Cath care. 5. Followup.  FOLLOWUP: 1. Dr. Cyndie Chime - the patient instructed to call the office to     schedule. 2. Labs including PT/INR on May 30, 2010, at the cancer center.  DISCHARGE MEDICATIONS: 1. Avelox 400 mg daily. 2. Enablex 7.5 mg 2 tablets daily. 3. Albuterol inhaler 2 puffs 4 times daily as needed.4. Tylenol 650 mg every 4 hours as needed. 5. Enteric-coated aspirin 81 mg daily. 6. Benadryl 25 mg every 6 hours as needed. 7. Diflucan 100 mg daily. 8. Sliding-scale insulin 3 times daily. 9. Protonix 40 mg daily. 10.Septra DS 1 tablet Monday, Wednesdays, and Fridays. 11.Valtrex 500 mg daily. 12.Coumadin - hold on May 26, 2010, and May 27, 2010.  Resume on     May 28, 2010, at 5 mg daily.  DISCHARGE DIAGNOSES: 1. Refractory chronic lymphocytic leukemia, heavily treated, status     post Rituxan and high-dose steroids. 2. History of recurrent deep venous thrombosis/pulmonary embolism. 3. Steroid-induced hyperglycemia. 4. Advanced degenerative arthritis. 5. Morbid obesity. 6. Hypertension. 7. Recent influenza A pneumonitis.     Lonna Cobb,  NP   ______________________________ Genene Churn. Cyndie Chime, M.D., F.A.C.P.    LT/MEDQ  D:  05/30/2010  T:  05/31/2010  Job:  [id number]  cc:   Lucky Cowboy, M.D. Fax: [phone]  Dr. Latanya Presser Department of Hematology/Oncology [facility]  Electronically Signed by Lonna Cobb N.P. on 06/02/2010 04:40:26 PM Electronically Signed by Cephas Darby M.D. on 06/06/2010 07:31:08 AM

## 2010-06-08 ENCOUNTER — Encounter (HOSPITAL_BASED_OUTPATIENT_CLINIC_OR_DEPARTMENT_OTHER): Payer: Medicare Other | Admitting: Oncology

## 2010-06-08 DIAGNOSIS — Z5112 Encounter for antineoplastic immunotherapy: Secondary | ICD-10-CM

## 2010-06-08 DIAGNOSIS — C9110 Chronic lymphocytic leukemia of B-cell type not having achieved remission: Secondary | ICD-10-CM

## 2010-06-15 ENCOUNTER — Encounter (HOSPITAL_BASED_OUTPATIENT_CLINIC_OR_DEPARTMENT_OTHER): Payer: Medicare Other | Admitting: Oncology

## 2010-06-15 ENCOUNTER — Other Ambulatory Visit: Payer: Self-pay | Admitting: Oncology

## 2010-06-15 DIAGNOSIS — C9110 Chronic lymphocytic leukemia of B-cell type not having achieved remission: Secondary | ICD-10-CM

## 2010-06-15 DIAGNOSIS — Z86718 Personal history of other venous thrombosis and embolism: Secondary | ICD-10-CM

## 2010-06-15 DIAGNOSIS — Z5112 Encounter for antineoplastic immunotherapy: Secondary | ICD-10-CM

## 2010-06-15 DIAGNOSIS — Z7901 Long term (current) use of anticoagulants: Secondary | ICD-10-CM

## 2010-06-15 LAB — CBC WITH DIFFERENTIAL/PLATELET
Basophils Absolute: 0 10*3/uL (ref 0.0–0.1)
EOS%: 2.9 % (ref 0.0–7.0)
Eosinophils Absolute: 0.1 10*3/uL (ref 0.0–0.5)
HCT: 30.6 % — ABNORMAL LOW (ref 34.8–46.6)
HGB: 9.4 g/dL — ABNORMAL LOW (ref 11.6–15.9)
LYMPH%: 71.2 % — ABNORMAL HIGH (ref 14.0–49.7)
MCH: 22.8 pg — ABNORMAL LOW (ref 25.1–34.0)
MCV: 74.1 fL — ABNORMAL LOW (ref 79.5–101.0)
MONO%: 10.6 % (ref 0.0–14.0)
NEUT#: 0.6 10*3/uL — ABNORMAL LOW (ref 1.5–6.5)
NEUT%: 14.5 % — ABNORMAL LOW (ref 38.4–76.8)
Platelets: 152 10*3/uL (ref 145–400)
RDW: 17.2 % — ABNORMAL HIGH (ref 11.2–14.5)

## 2010-06-15 LAB — PROTHROMBIN TIME: Prothrombin Time: 35.5 seconds — ABNORMAL HIGH (ref 11.6–15.2)

## 2010-06-21 ENCOUNTER — Inpatient Hospital Stay (HOSPITAL_COMMUNITY)
Admission: AD | Admit: 2010-06-21 | Discharge: 2010-06-23 | DRG: 842 | Disposition: A | Discharge status: Home / Self Care | Payer: Medicare Other | Source: Ambulatory Visit | Attending: Oncology | Admitting: Oncology

## 2010-06-21 ENCOUNTER — Other Ambulatory Visit: Payer: Self-pay | Admitting: Oncology

## 2010-06-21 ENCOUNTER — Encounter (HOSPITAL_BASED_OUTPATIENT_CLINIC_OR_DEPARTMENT_OTHER): Payer: Medicare Other | Admitting: Oncology

## 2010-06-21 DIAGNOSIS — D709 Neutropenia, unspecified: Secondary | ICD-10-CM

## 2010-06-21 DIAGNOSIS — I10 Essential (primary) hypertension: Secondary | ICD-10-CM | POA: Diagnosis present

## 2010-06-21 DIAGNOSIS — Z86711 Personal history of pulmonary embolism: Secondary | ICD-10-CM

## 2010-06-21 DIAGNOSIS — D638 Anemia in other chronic diseases classified elsewhere: Secondary | ICD-10-CM | POA: Diagnosis present

## 2010-06-21 DIAGNOSIS — T380X5A Adverse effect of glucocorticoids and synthetic analogues, initial encounter: Secondary | ICD-10-CM | POA: Diagnosis present

## 2010-06-21 DIAGNOSIS — C9110 Chronic lymphocytic leukemia of B-cell type not having achieved remission: Secondary | ICD-10-CM

## 2010-06-21 DIAGNOSIS — Z7901 Long term (current) use of anticoagulants: Secondary | ICD-10-CM

## 2010-06-21 DIAGNOSIS — Z961 Presence of intraocular lens: Secondary | ICD-10-CM

## 2010-06-21 DIAGNOSIS — N393 Stress incontinence (female) (male): Secondary | ICD-10-CM | POA: Diagnosis present

## 2010-06-21 DIAGNOSIS — E139 Other specified diabetes mellitus without complications: Secondary | ICD-10-CM | POA: Diagnosis present

## 2010-06-21 DIAGNOSIS — M1990 Unspecified osteoarthritis, unspecified site: Secondary | ICD-10-CM | POA: Diagnosis present

## 2010-06-21 DIAGNOSIS — Z86718 Personal history of other venous thrombosis and embolism: Secondary | ICD-10-CM

## 2010-06-21 DIAGNOSIS — M25559 Pain in unspecified hip: Secondary | ICD-10-CM | POA: Diagnosis present

## 2010-06-21 DIAGNOSIS — R609 Edema, unspecified: Secondary | ICD-10-CM | POA: Diagnosis present

## 2010-06-21 DIAGNOSIS — Z856 Personal history of leukemia: Secondary | ICD-10-CM

## 2010-06-21 DIAGNOSIS — Z7982 Long term (current) use of aspirin: Secondary | ICD-10-CM

## 2010-06-21 DIAGNOSIS — C9112 Chronic lymphocytic leukemia of B-cell type in relapse: Secondary | ICD-10-CM

## 2010-06-21 LAB — CBC WITH DIFFERENTIAL/PLATELET
EOS%: 1.5 % (ref 0.0–7.0)
Eosinophils Absolute: 0.1 10*3/uL (ref 0.0–0.5)
MCV: 74.2 fL — ABNORMAL LOW (ref 79.5–101.0)
MONO%: 1.7 % (ref 0.0–14.0)
NEUT#: 0.6 10*3/uL — ABNORMAL LOW (ref 1.5–6.5)
RBC: 4.14 10*6/uL (ref 3.70–5.45)
RDW: 17.9 % — ABNORMAL HIGH (ref 11.2–14.5)
lymph#: 3.3 10*3/uL (ref 0.9–3.3)

## 2010-06-21 LAB — COMPREHENSIVE METABOLIC PANEL
ALT: 25 U/L (ref 0–35)
AST: 19 U/L (ref 0–37)
Albumin: 3.5 g/dL (ref 3.5–5.2)
Alkaline Phosphatase: 229 U/L — ABNORMAL HIGH (ref 39–117)
Potassium: 3.3 mEq/L — ABNORMAL LOW (ref 3.5–5.3)
Sodium: 137 mEq/L (ref 135–145)
Total Protein: 5 g/dL — ABNORMAL LOW (ref 6.0–8.3)

## 2010-06-21 LAB — MORPHOLOGY

## 2010-06-21 LAB — GLUCOSE, CAPILLARY: Glucose-Capillary: 153 mg/dL — ABNORMAL HIGH (ref 70–99)

## 2010-06-21 LAB — PROTIME-INR
INR: 2.2 (ref 2.00–3.50)
Protime: 26.4 Seconds — ABNORMAL HIGH (ref 10.6–13.4)

## 2010-06-22 LAB — PROTIME-INR
INR: 1.96 — ABNORMAL HIGH (ref 0.00–1.49)
Prothrombin Time: 22.5 seconds — ABNORMAL HIGH (ref 11.6–15.2)

## 2010-06-22 LAB — URINALYSIS, ROUTINE W REFLEX MICROSCOPIC
Leukocytes, UA: NEGATIVE
Protein, ur: NEGATIVE mg/dL
Urobilinogen, UA: 0.2 mg/dL (ref 0.0–1.0)

## 2010-06-22 LAB — URINE MICROSCOPIC-ADD ON: Urine-Other: NONE SEEN

## 2010-06-22 LAB — PATHOLOGIST SMEAR REVIEW

## 2010-06-22 LAB — DIFFERENTIAL
Basophils Absolute: 0 10*3/uL (ref 0.0–0.1)
Eosinophils Absolute: 0 10*3/uL (ref 0.0–0.7)
Lymphocytes Relative: 75 % — ABNORMAL HIGH (ref 12–46)
Neutro Abs: 0.5 10*3/uL — ABNORMAL LOW (ref 1.7–7.7)
Neutrophils Relative %: 15 % — ABNORMAL LOW (ref 43–77)

## 2010-06-22 LAB — CBC
MCH: 23.2 pg — ABNORMAL LOW (ref 26.0–34.0)
Platelets: 119 10*3/uL — ABNORMAL LOW (ref 150–400)
RBC: 3.93 MIL/uL (ref 3.87–5.11)
WBC: 3.2 10*3/uL — ABNORMAL LOW (ref 4.0–10.5)

## 2010-06-22 LAB — GLUCOSE, CAPILLARY: Glucose-Capillary: 301 mg/dL — ABNORMAL HIGH (ref 70–99)

## 2010-06-22 NOTE — H&P (Signed)
NAMEMARCEE, [name]                 ACCOUNT NO.:  [id number]  MEDICAL RECORD NO.:  [id number]           PATIENT TYPE:  I  LOCATION:  1315                         FACILITY:  [facility]  PHYSICIAN:  Levert Feinstein, M.D., F.A.C.P.DATE OF BIRTH: 1940-12-07  DATE OF ADMISSION:  06/21/2010 DATE OF DISCHARGE:                             HISTORY & PHYSICAL   CHIEF COMPLAINT:  Elective admission to begin cycle four of high-dose steroids plus Rituxan for relapsed chronic lymphocytic leukemia.  HISTORY OF PRESENT ILLNESS:  [name] is a 70 year old woman with longstanding chronic lymphocytic leukemia initially diagnosed in December 2001.  She has been treated with multiple chemotherapy agents over the years.  She has had many remissions and relapses.  The disease has become increasingly refractory to treatment with shorter and shorter remission durations and only partial remissions.  Please see multiple recent histories, physicals and discharge summaries for full details of past treatments.  She started to develop progressive adenopathy and constitutional symptoms despite standard treatments.  I sent her to [facility] for a second opinion.  She began a salvage regimen with high-dose steroids plus Rituxan on April 18, 2010, and received the first treatment at [facility]. Overall she has tolerated the treatments extremely well.  She did develop an influenza A viral pneumonitis following her first cycle requiring hospitalization, but she has had no subsequent infectious complications on prophylactic antibacterials, antivirals and antifungals.  She is receiving the Rituxan weekly and despite a remote allergic reaction to the product, when given with high-dose steroid premedication, she has been able to tolerate this drug.  She is receiving the high-dose steroids on a monthly basis.  She has had three cycles to date on February 27, March 14, and April 2.  She is admitted now for a fourth and final  planned cycle.  Of note, she has had a significant stabilization since being on this program.  She has had complete regression of bulky lymphadenopathy.  She has had stabilization of her hematology profile.  PAST MEDICAL HISTORY:  Includes essential hypertension, steroid-induced hyperglycemia, history of deep venous thrombosis and then subsequent pulmonary embolus when she was taken off anticoagulants.  Morbid obesity.  Recent stress incontinence.  Advanced degenerative arthritis. Cataracts status post bilateral intraocular lens implants.  She has a Port-A-Cath device which had to be placed in her right proximal arm due to her weight.  She has had problems with recurrent pneumonia and pneumonitis.  I did have her on a program of monthly intravenous immunoglobulin for about a year and she also had to have a surgical procedure to drain her sinuses.  Subsequent to this, sinopulmonary infections have been minimal.  CURRENT MEDICATIONS: 1. Prophylactic Septra DS one p.o. Monday, Wednesday, Friday. 2. Valtrex 500 mg daily. 3. Diflucan 100 mg daily. 4. Avelox 400 mg daily.  Her primary care doctor had her stop the     Avelox about 6 days ago and put her on a brief course of     doxycycline in view of a cough with productive sputum on the     Avelox. 5. He also started her on Glucophage  500 mg b.i.d. 6. Enablex 7.5 mg daily. 7. Combivent inhaler two puffs q.i.d. p.r.n. 8. Protonix 40 mg daily. 9. Tylox 5/325 one to two q.4 hours p.r.n. pain. 10.Dalmane 15-30 mg h.s. p.r.n. sleep. 11.Vitamin D 30,000 units weekly. 12.She is on Coumadin with fluctuating INRs due to her need for these     treatments.  Current dose is 4 mg daily, reduced when INR was     supratherapeutic.  ALLERGIES:  SHE HAS NO MEDICATION ALLERGIES.  FAMILY HISTORY:  Family history is noncontributory.  SOCIAL HISTORY:  She lost her husband with metastatic lung cancer last year.  She has a very supportive family with  two sons and two daughters. She does not smoke or use alcohol.  ADDITIONAL REVIEW OF SYSTEMS:  She denies any headache, change in vision.  She feels that her cough has almost completely resolved.  No dyspnea at rest.  She does get dyspneic on mild exertion.  No ischemic- type chest pain or palpitations.  She is having some vague mid abdominal pain but no change in bowel habits.  No hematochezia or melena.  She recently developed stress incontinence.  This has been partially relieved by Enablex.  No hematuria.  Chronic arthritic pain in her knees and hips.  She still complains of significant fatigue.  PHYSICAL EXAMINATION:  GENERAL:  A pleasant African-American woman. VITAL SIGNS:  Blood pressure 111/67, pulse 110 regular, respirations 20, temperature 98.7.  Height 5 feet 3 inches.  Weight 247 pounds.  Body surface area 2.2 m2. SKIN/HAIR/NAILS:  Normal. HEENT:  Pupils are small, weakly reactive, round and equal.  Bilateral intraocular lens implants.  Pharynx no erythema or exudate. NECK:  Supple.  No thyromegaly.  Carotids 2+.  There has been complete regression of bulky cervical, supraclavicular and bi-axillary lymphadenopathy. LUNGS:  Clear and resonant to percussion throughout. CARDIOVASCULAR:  Regular cardiac rhythm.  No murmur. ABDOMEN:  Soft, obese, minimally tender in the periumbilical region.  No obvious mass or organomegaly. EXTREMITIES:  Chronic 1+ edema.  Port-A-Cath infusion device right proximal arm. NEUROLOGIC:  Mental status intact.  Cranial nerves grossly normal. Motor strength 5/5.  Reflexes 1+ symmetric.  Upper body coordination normal.  Gait not tested.  LABORATORY DATA:  Hemoglobin 9.9, hematocrit 30.7, MCV 74, white count 4000 with 16% neutrophils, 81% lymphocytes, platelet count 159,000. Prothrombin time is 26.4 seconds.  INR 2.2 on Coumadin 4 mg daily. Chemistry profile pending.  IMPRESSION AND PLAN: 1. Multiply relapsed chronic lymphocytic leukemia  responding to     salvage treatment.  Plan:  Proceed with cycle #4 as follows:     Rituxan 375 mg per meter squared total dose 825 mg intravenous over     4 hours day one plus Solu-Medrol 1 gram per meter squared total     dose 2.2 grams over 3 hours intravenous days one, two and three.     Continue prophylactic antibacterials, antifungals and antiviral     drugs. 2. History of deep vein thrombosis and pulmonary embolus on chronic     Coumadin anticoagulation.  Plan:  Continue the same. 3. Steroid-induced hyperglycemia.  I will cover her with low-dose     Lantus insulin and p.r.n. regular insulin coverage while she     receives the high-dose steroids program. 4. Essential hypertension.  Blood pressure currently controlled on no     specific medications. 5. Morbid obesity. 6. Urinary stress incontinence. 7. Degenerative arthritis.     Levert Feinstein, M.D., F.A.C.P.  JMG/MEDQ  D:  06/21/2010  T:  06/21/2010  Job:  [id number]  cc:   Lucky Cowboy, M.D. Fax: [phone]  Dr. Latanya Presser Department of Hematology [facility] [place], [place]  Electronically Signed by Cephas Darby M.D. on 06/22/2010 11:16:33 AM

## 2010-06-23 LAB — DIFFERENTIAL
Basophils Relative: 0 % (ref 0–1)
Eosinophils Relative: 0 % (ref 0–5)
Lymphs Abs: 2.8 10*3/uL (ref 0.7–4.0)
Monocytes Absolute: 0.4 10*3/uL (ref 0.1–1.0)
Monocytes Relative: 11 % (ref 3–12)
Neutrophils Relative %: 18 % — ABNORMAL LOW (ref 43–77)

## 2010-06-23 LAB — BASIC METABOLIC PANEL
CO2: 26 mEq/L (ref 19–32)
Chloride: 104 mEq/L (ref 96–112)
Creatinine, Ser: 0.64 mg/dL (ref 0.4–1.2)
GFR calc Af Amer: >60 mL/min (ref >60)
Potassium: 3.3 mEq/L — ABNORMAL LOW (ref 3.5–5.1)
Sodium: 139 mEq/L (ref 135–145)

## 2010-06-23 LAB — GLUCOSE, CAPILLARY
Glucose-Capillary: 402 mg/dL — ABNORMAL HIGH (ref 70–99)
Glucose-Capillary: 279 mg/dL — ABNORMAL HIGH (ref 70–99)
Glucose-Capillary: 373 mg/dL — ABNORMAL HIGH (ref 70–99)

## 2010-06-23 LAB — CBC
HCT: 27.7 % — ABNORMAL LOW (ref 36.0–46.0)
Hemoglobin: 8.4 g/dL — ABNORMAL LOW (ref 12.0–15.0)
MCV: 73.3 fL — ABNORMAL LOW (ref 78.0–100.0)
RDW: 16.9 % — ABNORMAL HIGH (ref 11.5–15.5)
WBC: 3.9 10*3/uL — ABNORMAL LOW (ref 4.0–10.5)

## 2010-06-23 NOTE — Discharge Summary (Signed)
[name], [name]                 ACCOUNT NO.:  [id number]  MEDICAL RECORD NO.:  [id number]           PATIENT TYPE:  I  LOCATION:  1315                         FACILITY:  [facility]  PHYSICIAN:  Levert Feinstein, M.D., F.A.C.P.DATE OF BIRTH: 05-Oct-1940  DATE OF ADMISSION:  06/21/2010 DATE OF DISCHARGE:  06/23/2010                              DISCHARGE SUMMARY   HISTORY OF PRESENT ILLNESS:  This was an elective admission to continue salvage chemotherapy/immunotherapy for this 70 year old woman with longstanding chronic lymphocytic leukemia.  Please see history and physical for full details of prior treatments.  Due to refractory disease, she was started on a salvage regimen recommended by the hematologists at [facility] employing high-dose Solu-Medrol plus Rituxan anti-B-cell antibody beginning in February of this year. She has had three courses to date, the first cycle given at [facility], second two cycles given here in [place].  She is admitted at the present time for the fourth and final planned cycle.  Complications to date have included influenza A pneumonia following the first cycle, requiring hospitalization here.  Other than that she has tolerated the program extremely well.  She is on antiviral, antibacterial and antifungal prophylaxis.  She has developed steroid- induced diabetes and has been covered with insulin when she receives the high doses of steroids.  PHYSICAL EXAMINATION ON ADMISSION:  Physical examination on admission shows complete regression of previous bulky cervical, supraclavicular and axillary lymphadenopathy.  She is morbidly obese, so liver and spleen were not palpable.  She has chronic lower extremity edema, unchanged.  She has a Port-A-Cath infusion device in the right proximal arm with no evidence for acute infection.  HOSPITAL COURSE:  Based on her height of 5 feet 3 inches, weight of 247 pounds, and body surface area 2.2 m2, she received Rituxan 375 mg  per meter squared IV infusion over 4 hours on day one along with Solu-Medrol 1 gram per meter squared, total dose 2.2 grams IV over 3 hours daily x3 doses.  Due to previous reaction to Rituxan, the Solu-Medrol high-dose was given prior to the Rituxan dose and she tolerated the product without any acute problems.  Her treatment was started on May 1 and completed on May 3.  She tolerated the treatments very well with no acute toxicities.  She was covered with Lantus insulin and p.r.n. regular insulin.  Peak glucose did rise up to 400.  She remained afebrile with stable blood pressures throughout treatment.  There were no complications.  A CBC on admission showed improvement compared with prior CBCs with a white count of 4000 with now the appearance of some neutrophils making up 16% of the differential with persistent lymphocytosis 81% and normal platelet count of 159,000.  She is on Coumadin 4 mg daily.  INR was 2.2 on admission and 2.7 at the time of discharge on 4 mg daily.  CBC on the day of discharge, May 3, with white count 3900, 18% neutrophils, 71 lymphocytes, hemoglobin of 8.4, platelet count of 153,000.  Potassium slightly decreased on admission at 3.3.  She was given oral potassium replacement.  Potassium remained at 3.3 at  discharge and potassium supplements will be continued at home for the next 10 days.  Renal function normal with BUN 12, creatinine 0.6.  PHYSICAL EXAMINATION ON DISCHARGE:  LYMPHATICS:  No adenopathy. VITAL SIGNS:  No fever. RESPIRATORY:  Clear lungs. HEART:  Regular cardiac rhythm. ABDOMEN:  Soft. EXTREMITIES:  With trace edema, unchanged.  DIAGNOSES: 1. 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 3. 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 4. Hypertension. 5. Morbid obesity. 6. Stress incontinence. 7. 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  DISPOSITION:  Condition stable at  time of discharge.  ACTIVITY:  She will resume regular activity.  DIET:  Regular diet.  FOLLOWUP:  Follow up in our office next week on May 8 to continue weekly Rituxan for three more doses to complete her planned salvage course.  DISCHARGE MEDICATIONS:  Medications to include: 1. Avelox 400 mg daily. 2. Potassium 20 mEq b.i.d. times 10 days. 3. Albuterol inhaler two puffs q.i.d. p.r.n. 4. Aspirin enteric-coated 81 mg daily. 5. Benadryl 25 mg q.6 hours p.r.n. itching or sleep. 6. Coumadin 4 mg daily. 7. Dalmane 15 mg one to two h.s. p.r.n. sleep. 8. Enablex 7.5 mg one daily for stress incontinence. 9. Diflucan 100 mg daily. 10.She will resume Glucophage 500 mg b.i.d. 11.Continue Protonix 40 mg daily. 12.Septra DS one tablet Monday, Wednesday and Friday. 13.Tylenol 650 mg q.6 hours p.r.n. minor pain or headache. 14.Valtrex 500 mg daily. 15.Vitamin D 50,000 units Monday, Wednesday, and Friday.     Levert Feinstein, M.D., F.A.C.P.     JMG/MEDQ  D:  06/23/2010  T:  06/23/2010  Job:  [id number]  cc:   Lucky Cowboy, M.D. Fax: [phone]  Dr. Latanya Presser Department of Hematology/Oncology [facility] [place], [place]  Electronically Signed by Cephas Darby M.D. on 06/23/2010 11:37:25 AM

## 2010-06-29 ENCOUNTER — Encounter (HOSPITAL_BASED_OUTPATIENT_CLINIC_OR_DEPARTMENT_OTHER): Payer: Medicare Other | Admitting: Oncology

## 2010-06-29 ENCOUNTER — Other Ambulatory Visit: Payer: Self-pay | Admitting: Oncology

## 2010-06-29 DIAGNOSIS — C9110 Chronic lymphocytic leukemia of B-cell type not having achieved remission: Secondary | ICD-10-CM

## 2010-06-29 DIAGNOSIS — Z86718 Personal history of other venous thrombosis and embolism: Secondary | ICD-10-CM

## 2010-06-29 DIAGNOSIS — Z7901 Long term (current) use of anticoagulants: Secondary | ICD-10-CM

## 2010-06-29 DIAGNOSIS — Z5112 Encounter for antineoplastic immunotherapy: Secondary | ICD-10-CM

## 2010-06-29 LAB — CBC WITH DIFFERENTIAL/PLATELET
BASO%: 0.5 % (ref 0.0–2.0)
Basophils Absolute: 0 10*3/uL (ref 0.0–0.1)
EOS%: 1.2 % (ref 0.0–7.0)
HCT: 30.7 % — ABNORMAL LOW (ref 34.8–46.6)
HGB: 9.6 g/dL — ABNORMAL LOW (ref 11.6–15.9)
LYMPH%: 80.4 % — ABNORMAL HIGH (ref 14.0–49.7)
MCH: 22.8 pg — ABNORMAL LOW (ref 25.1–34.0)
MCHC: 31.3 g/dL — ABNORMAL LOW (ref 31.5–36.0)
MCV: 72.9 fL — ABNORMAL LOW (ref 79.5–101.0)
NEUT%: 9.6 % — ABNORMAL LOW (ref 38.4–76.8)
Platelets: 107 10*3/uL — ABNORMAL LOW (ref 145–400)

## 2010-07-05 NOTE — Discharge Summary (Signed)
[name], [name]                 ACCOUNT NO.:  [id number]   MEDICAL RECORD NO.:  [id number]          PATIENT TYPE:  INP   LOCATION:  1315                         FACILITY:  [facility]   PHYSICIAN:  Genene Churn. Granfortuna, M.D.DATE OF BIRTH:  24-May-1940   DATE OF ADMISSION:  10/01/2006  DATE OF DISCHARGE:  10/05/2006                               DISCHARGE SUMMARY   HISTORY:  This was one of multiple hospital admissions for this 70-year-  old woman with longstanding chronic lymphocytic kidney.  She had initial  responses to fludarabine based chemotherapy regimens.  She progressed.  She has had additional treatment with Campath anti-CD-52 monoclonal  antibody.  She most recently progressed in lymph nodes despite white  cell response to the Campath and was started on a trial of Revlimid  Seventeen days into the first course of treatment, she developed rapid  leukopenia and low-grade fever, and was admitted here.  Cultures were  negative.  She was discharged after 72 hours.  White count was allowed  to recover.  She started a second course of Revlimid 1 week prior to the  current admission.  She presents at this time with a 72-hour history  initially of low-grade fevers, and then a temperature elevation to 101.7  degrees.  In view of relative neutropenia and overall immunocompromised  status, she was admitted for further evaluation.   Initial exam by Dr. Arline Asp showed a pleasant obese African-American  woman in no distress.  Pulse 84 regular, blood pressure 125/72,  temperature 100.7, respirations unlabored.  Pertinent findings included  absence of any exudate in the oropharynx.  Clear lungs.  No cardiac  murmurs.  A Port-A-Cath infusion device in the antecubital fossa of the  right arm which was nontender.  No exudate.   HOSPITAL COURSE:  Cultures were obtained and she was started on  parenteral antibiotics.  Initial choice with cefazolin plus Cipro.  Prophylactic Valtrex that she was  taking as an outpatient continued.  Initial CBC with a white count of 9200 with 14% neutrophils, 63%  lymphocytes, 14% eosinophils, hemoglobin of 9.7, hematocrit 30 and  platelet count of 124,000.  ProTime 19.5 seconds, INR of 1.6.  Repeat  ProTime without any dose change 23 seconds with INR of 2.0 on October 03, 2006.  Initial sodium 133, potassium 3.0, random glucose 119, BUN 1,  creatinine 0.8, albumin 3.2, SGOT 20, SGPT 23, alkaline phosphatase 169,  bilirubin 0.5, LDH 265 which is lower than recent outpatient values.  Uric acid 6.8.  Urinalysis with no protein, nitrite and leukocyte  esterase negative.  Cultures of urine 8000 colonies insignificant  growth.   While in the hospital she developed a nonproductive cough.  She became  afebrile after the first 24 hours.  However, she had a single  temperature spike to 101 degrees on the third hospital day.  Chest  radiograph showed no infiltrates or effusions.  Lung exam remained  normal.  By that point cultures for bacterial organisms were all  negative.  I felt it was most likely that she had a viral bronchitis.  She  was changed from antibiotics to parenteral antivirals with full-dose  acyclovir for 24 hours.  Once again, in the evening before discharge she  had a single low-grade temperature to 100 degrees.  This has been her  pattern at home with the usual single daily temperature elevations in  the evenings.   CMV titers were elevated against IgG but not IgM.  I had asked for a CMV  test by [employer], but due to a miscommunication in the lab this  was not done.  However, there was no gross evidence for reactivation CMV  based on the antibody levels.   White blood count began to rise from her admission value of 9200 up to a  value on day of discharge of 22,500 with 56% lymphocytes, 5 neutrophils,  25 monocytes, and 13 lymphocytes.  This was due to the fact that we  stopped her Revlimid at time of admission.   Overall she  appeared clinically stable and not toxic.  I felt that we  could safely continue her antiviral therapy as an outpatient.   Repeat electrolyte panel done on August 14 showed normalization of  sodium 138 and potassium now 4.1 after oral and parenteral replacement.   CONSULTATIONS:  None.   PROCEDURES:  None.   DIAGNOSIS:  1. Viral bronchitis.  2. Fever and neutropenia.  3. Advanced chronic lymphocytic leukemia.  4. History of left lower extremity DVT on chronic Coumadin.   DISPOSITION:  Condition stable at time of discharge.  She will resume  limited activity, regular diet.  Followup in my office in 2 weeks.  Call  for any fever 101 or above.   MEDICATIONS TO INCLUDE:  1. Valtrex 500 mg t.i.d. for 1 week and then 500 mg daily.  2. Tussionex 1 teaspoon p.o. b.i.d. for cough.  3. Coumadin 10 mg p.o. daily.  4. Restoril 15 mg h.s. p.r.n. sleep.  5. She is advised to resume her Revlimid 5 mg daily x 14 days      beginning on Monday October 08, 2006, if her temperature stays down      over the weekend.      Genene Churn. Cyndie Chime, M.D.  Electronically Signed     JMG/MEDQ  D:  10/05/2006  T:  10/05/2006  Job:  [id number]   cc:   Lucky Cowboy, M.D.  Fax: [phone]

## 2010-07-05 NOTE — H&P (Signed)
NAMELACONDA, BASICH                 ACCOUNT NO.:  [id number]   MEDICAL RECORD NO.:  [id number]          PATIENT TYPE:  INP   LOCATION:  1315                         FACILITY:  [facility]   PHYSICIAN:  Samul Dada, M.D.DATE OF BIRTH:  [birth year]   DATE OF ADMISSION:  10/01/2006  DATE OF DISCHARGE:                              HISTORY & PHYSICAL   HISTORY:  [name] is a 70 year old African American female who is  being admitted this evening as a direct admission because she developed  a fever on Saturday evening, September 29, 2006, i.e. about 48 hours ago.  Initially fever was in the 100 to 101 range.  However, the patient  called me to this evening to tell me that her fever was 101.9.  She  admitted to feeling chilly but denied any actual rigors.  At the time  she has felt a little washed out, but there has been no dramatic change  in her condition.  Specifically, she denied any respiratory,  GU or GI  symptomatology. She has had a very mild left-sided headache.  Denies  stiff neck, any change in sensorium or functional status. No eye  symptoms.   The patient's history is dominated by her chronic lymphocytic leukemia  which was diagnosed in December 2001.  The patient has received multiple  treatments including fludarabine based treatment, Rituxan, Campath and  most recently Revlimid, which she receives on a four-week cycle, three  weeks on, one week off.  She also receives IVIG for chronic upper  respiratory infections.  She has had multiple admissions to the hospital  in the past and the most recent admission has involved fever.  In fact,  the patient was admitted to the hospital from June 1 to June 4 because  of neutropenia and fever.  Cultures were negative.  The patient was  discharged on Cipro 500 mg twice a day.  She has been maintained on that  dosage.  She has also been on Valtrex 500 mg daily.  She had been on  Septra at one time but this has recently discontinued.   The patient had  been followed closely through the office.  She said that had a chest x-  ray within the past month.   The case was discussed with Dr. Cyndie Chime this evening and he  recommended that the patient be admitted to the hospital for cultures  and for IV antibiotics.   PAST MEDICAL HISTORY:  1. The patient has had tonsillectomy in her 53s.  2. Total abdominal hysterectomy.  3. Bilateral salpingo-oophorectomy,  4. Arthroscopic surgery on her left knee.  5. DVT involving her right lower extremity in 2003, treated with      Coumadin.  The patient had some problems with her previous Port-A-      Caths and now has a port in her upper right arm.  6. She also has degenerative arthritis.  7. She has history of hypertension.  8. GERD.  9. Morbid obesity.  10.Dyslipidemia.   ALLERGIES:  No allergies to any medicines.   CURRENT MEDICATIONS:  1. Valtrex  500 mg daily.  2. Coumadin 10 mg daily.  3. Restoril 30 mg at bedtime.  4. Peridex oral rinses four times a day.  5. Cipro 500 mg b.i.d.  6. In the past the patient had been on allopurinol and Septra.  The      patient tells me the Septra was discontinued.   FAMILY HISTORY:  Negative for cancer or blood disorders.  Family history  is positive for diabetes mellitus and heart disease.  The patient has  four adult children, six grandchildren, and one great-grandchild who are  all in good health.   SOCIAL HISTORY:  The patient denies any history of use of tobacco  products or alcohol.  She was born in [place] and has lived in  [place] for 60 years.  She has been married to her husband who is  receiving treatment for lung cancer at the [facility].  She has been  married for 37 years.  The patient worked at the [employer] in [place], retired in [place] she was diagnosed CLL.  She drives.   REVIEW OF SYSTEMS:  The patient denies any sense of ill health.  She  does have some periods of  feeling sluggish with poor energy.  She has  chronic dyspnea on exertion, dry and productive cough.  She wears  glasses.  Vision and hearing are good.  She denies any sinus problems.  She has a mild left-sided headache.  Denies any other neurologic  problems.  Weight is about 280 pounds.  There has been no recent change.  Height is 5 feet 3 inches.  She denies any other GI problems, nausea,  vomiting, diarrhea, constipation, blood in the stools, any liver  problems.  She denies any cardiac problems.  Respiratory system as  above.  She denies any urinary problems.  She has chronic swelling of  the legs, left greater than right.  She denies any bleeding or bruising  problems.  She has arthritis in her knees.  No fever prior to Saturday  evening.   PHYSICAL EXAMINATION:  GENERAL:  Mrs. Magley is a delightful lady in no  acute distress.  She has a very nice sense of humor.  Is in good  spirits.  VITAL SIGNS:  Oxygen saturation on room air 100%.  Temperature 100.7,  pulse 84 and regular, respirations regular and unlabored.  Blood  pressure 125/72.  HEENT: She wears glasses.  No scleral icterus.  Pupillary and  extraocular movements are normal.  Mouth and pharynx are benign.  No  oral pathology or thrush.  NECK:  With adenopathy, particularly in the left neck with lymph nodes  up to 3 cm.  She has a fairly large lymph node in the right axilla,  measuring about 4, perhaps 5 cm.  No obvious thyroid enlargement or  bruit.  LUNGS:  Clear to percussion and auscultation.  CARDIAC:  Regular rhythm without murmur or rub.  BREASTS: Without masses.  ABDOMEN:  Massively obese, nontender, with no organomegaly or masses  palpable.  No obvious inguinal adenopathy.  EXTREMITIES:  Puffy with perhaps trace pitting edema of the lower legs,  perhaps 1+ at the ankle and feet.  No purpura or petechiae or clubbing.  The patient does have a P.A.S. Port in the medial aspect of her right  arm.  There is some  discoloration in this area which the patient  attributes to IVIG.  NEUROLOGIC:  Grossly normal.   LABORATORY DATA:  No  laboratory data available from today.  We have lab  data from August 5.  Hemoglobin was 10.2, hematocrit 34.1.  White count  16.2, with an ANC of 0.7, platelets 218,000.  Chemistries were notable  for an LDH of 344.  BUN was 10.  Creatinine 0.8.   IMPRESSION AND PLAN:  The patient will be admitted to the hospital with  the following problems.  1. Fever.  This could be due to infection, but it could also      conceivably be due to the transformation of her chronic lymphocytic      leukemia to a high grade lymphoma.  Other considerations would be      reactivation of cytomegalovirus.  Other viral entities are      certainly possibilities.  The patient may benefit from an      infectious disease consultation for management.  In the meantime,      the patient is being pancultured.  We will continue her Cipro and      make this IV.  Also add some gram positive coverage with Kefzol 1 g      every 8 hours.  Have a discussion with Dr. Cyndie Chime. Vancomycin      will be held for now even though the patient does have a P.A.S.      Port in the right arm.  2. Chronic lymphocytic leukemia, currently on Revlimid and IVIG with      diagnosis going back to December 2001.  3. History of deep venous thrombosis involving the right lower      extremity, on chronic Coumadin.  4. Other problems as noted above include hypertension,      gastroesophageal reflux disease, and dyslipidemia.      Samul Dada, M.D.  Electronically Signed     DSM/MEDQ  D:  10/01/2006  T:  10/02/2006  Job:  [id number]   cc:   Genene Churn. Cyndie Chime, M.D.  Fax: [phone]   Lucky Cowboy, M.D.  Fax: [phone]

## 2010-07-05 NOTE — Op Note (Signed)
[name], [name]                 ACCOUNT NO.:  [id number]   MEDICAL RECORD NO.:  [id number]          PATIENT TYPE:  OIB   LOCATION:  5118                         FACILITY:  [facility]   PHYSICIAN:  Kinnie Scales. Annalee Genta, M.D.DATE OF BIRTH:  Jul 16, 1940   DATE OF PROCEDURE:  10/11/2007  DATE OF DISCHARGE:                               OPERATIVE REPORT   Location is [facility] Main OR.   Pre and postoperative diagnoses and indications for surgery is the same  and they are,  1. Chronic sinusitis.  2. Chronic lymphocytic leukemia.   SURGICAL PROCEDURE:  Bilateral endoscopic sinus surgery with computer-  assisted navigation (Stealth consisting of bilateral total ethmoidectomy  and bilateral maxillary antrostomy with removal of diseased tissue).   SURGEON:  Kinnie Scales. Annalee Genta, MD   ANESTHESIA:  General endotracheal.   COMPLICATIONS:  None.   ESTIMATED BLOOD LOSS:  Approximately 500 mL.  The patient transferred  from the operating room to the recovery room in stable condition.   BRIEF HISTORY:  [name] is a 70 year old black female who was referred  by Dr. Riley Churches at the [facility].  She  has a history of chronic lymphocytic leukemia, has been followed closely  through his office and treated with ongoing antibiotic therapy because  of underlying immunosuppression.  Unfortunately, over the last 6-12  months, the patient has had worsening symptoms with chronic sinusitis,  postnasal drainage, coughing, and low-grade fevers with chronic  headaches.  She had been treated with aggressive broad-spectrum  antibiotic therapy with some limited improvement, but quickly recurred.  CT scanning was performed after treatment and she was found to have  significant sinonasal disease with soft tissue inflammation and air-  fluid levels within the sinuses.  Given her history, examination, and  failure to respond to appropriate antibiotic therapy, I have  recommended  that we consider her for bilateral endoscopic sinus surgery.  The risk,  benefits, and possible complications of procedure were discussed in  detail with the patient.  The patient understood and concurred with our  plan for surgery.  Prior to surgery, a Stealth formatted CT scan of the  sinus was obtained in order to use intraoperative computer-assisted  navigation for safety and efficacy.   SURGICAL PROCEDURE:  The patient was brought to the operating room on  October 11, 2007, and placed in supine position on the operating table.  General endotracheal anesthesia was established without difficulty and  the patient was adequately anesthetized.  Her nose was examined and  injected with a total of 4 mL of 1% lidocaine, 1:100,000 solution of  epinephrine injected in the submucosal fashion along the lateral nasal  wall and inferior turbinates bilaterally.  The patient's nose was then  packed with Afrin-soaked cottonoid pledgets, were left in place for  approximately 10 minutes.  The patient was then positioned on the  operating table and prepped and draped in a sterile fashion.  The  Stealth navigation headgear was applied and anatomic and surgical  landmarks were identified and confirmed.   The surgical procedure was then  begun on the patient's right hand side  using 0 degree telescope and a straight microdebrider.  The uncinate  process was gently reflected anteriorly with a forceps and then  resected.  Dissection was then carried out through the ethmoid bulla and  a total ethmoidectomy was performed from anterior to posterior resecting  bony septations and diseased mucosa.  The posterior aspect of the  ethmoid sinus was identified with the navigation tool and dissection was  then carried from posterior to anterior on the roof of the ethmoid sinus  removing polypoid disease.  Attention was then turned to lateral nasal  wall, and using a 45-degree telescope, natural ostium of  the maxillary  sinus was identified.  This was enlarged in an anterior and posterior  direction creating a widely patent maxillary sinus within the sinus  polypoid mucosa and debris was resected.  The patient had a significant  amount of bleeding during the operation.  This diffuse bleeding from the  sinonasal mucosa perhaps is related to her underlying CLL.  This  somewhat hampered visualization, but the procedure was accomplished in a  safe fashion using the navigation tool.   Attention was then turned to the patient's left hand side where the same  procedure was carried out.  The middle turbinate was gently medialized.  Uncinate process was reflected and resected with a through cutting  forceps.  Dissection was then carried out through the ethmoid bulla and  ethmoid air cells from anterior to posterior removing bony septations  and polypoid disease.  Attention was turned to lateral nasal wall where  again the natural ostia and the maxillary sinus were completely  occluded.  This was enlarged in anterior and posterior direction  removing polypoid mucosa from the ostium and within the sinus.  The left  maxillary sinus was widely patent at the conclusion of procedure and the  ethmoid was cleared of surgical debris and disease.  Again, significant  amount of bleeding was encountered throughout this aspect of the  operation, but it was accomplished safely and anatomic landmarks were  confirmed throughout the case using the Stealth system.   With the sinus surgery completed, nasal cavity was irrigated and  suctioned and orogastric tube was passed.  Stomach contents were  aspirated.  The patient's surgical sites were thoroughly examined and  sinus debris was removed.  Bilateral Kennedy sinus pack was then placed  within the common ethmoid cavity, and after the application of Bactroban  ointment and hydrated with sterile saline, the patient was then awakened  from anesthetic.  She was  extubated and was transferred from the  operating room to recovery room in stable condition.   There were no complications.   Blood loss was approximately 500 mL.           ______________________________  Kinnie Scales. Annalee Genta, M.D.     DLS/MEDQ  D:  81/19/1478  T:  10/11/2007  Job:  [id number]

## 2010-07-05 NOTE — H&P (Signed)
[name], [name]                 ACCOUNT NO.:  [id number]   MEDICAL RECORD NO.:  [id number]          PATIENT TYPE:  INP   LOCATION:  1325                         FACILITY:  [facility]   PHYSICIAN:  Genene Churn. 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  She progressed on  initial fludarabine-based treatment.  She did not tolerate rituxan.  She  has had some nice responses to intermittent treatment with anti-CD52  antibody (Campath) given initially in April 2005, a second course in  December 2006, and the third course in February 2008.  Despite a  dramatic fall in her white count and stabilization of hemoglobin and  platelets, she developed progressive peripheral and central  lymphadenopathy.  She was recently started on a trial of Revlimid 5 mg  daily, 21 days on, 7-day rest beginning on 06/25/2006.  Unfortunately, I  had to stop the drug on 05/17 when she developed the rapid onset of  neutropenia and subsequent pustular rash on her face and bib area.  She  was started on oral ciprofloxacin prophylaxis in addition to chronic PCP  prophylaxis with Septra and viral prophylaxis with Valtrex.  She called  this evening complaining of a 24-hour history of low-grade fever and  shaking chills with rise in temperature now up to 101 degrees.  In view  of her known neutropenia, she is admitted for IV-antibiotics.   She has been having some intermittent headaches.  No change in her  vision.  No flare-up of her chronic bronchitis.  She has noted some pink  tinge to her urine and urinary frequency.  No nausea, vomiting, or  diarrhea.  No earache.  No sore throat.  Her  skin rash has improved off  the Revlimid.   PAST MEDICAL HISTORY:  Left lower extremity DVT in November 2002.  I  have kept her on chronic Coumadin due to her morbid obesity and her  leukemia treatments.  She has chronic bronchitis/pneumonitis and has had  multiple hospital admissions for the same.  She is currently on a  program of monthly intravenous immunoglobulin to decrease frequency and  severity of infections.  The last dose was given on 07/31/2006.   She has no cardiac problems, no diabetes, no ulcers.  No history of  hepatitis or yellow jaundice, no kidney stones, no thyroid trouble.  No  history of stroke.  She has degenerative arthritis and has had  arthroscopic surgery on her left knee.  She is morbidly obese.  Due to  poor vascular access, she has a Port-A-Cath infusion device which had to  be placed in a brachial vein on her right upper extremity.   CURRENT MEDICATIONS:  Her current medications include the following:  1. Coumadin 10 mg daily.  2. Valtrex 500 mg daily.  3. Septra DS 1 p.o. b.i.d. on Monday, Wednesday, Friday.  4. Restoril 30 mg at bedtime p.r.n., sleep.  5. Benadryl 25 mg q.6h. p.r.n.  6. Tussionex 1 teaspoon b.i.d. p.r.n.Marland Kitchen   ALLERGIES:  NO ALLERGIES.   FAMILY HISTORY AND SOCIAL HISTORY:  See multiple recent H&Ps and  discharge summaries.  Unfortunately, her husband is now undergoing  chemotherapy for advanced lung cancer.  She is a nonsmoker and  nondrinker.   REVIEW OF SYSTEMS:  See HPI.  In addition, she denies any dyspnea.  No  chest pain or pressure, no palpitations, no increased cough.  No  abdominal pain, nausea, or vomiting.  No diarrhea.   PHYSICAL EXAMINATION:  GENERAL:  She is in no acute distress.  VITAL SIGNS: Pulse 100 and regular, blood pressure 115/67, temperature  99.9, respirations 20.  Oxygen saturations 100% on room air.  HEENT:  The pupils were equal and reactive to light.  Pharynx:  No  erythema or exudate.  There is a  minimal residual pustular rash around  her nose which has improved greatly since office visit last week.  NECK:  Her neck is supple.  No thyromegaly.  Adenopathy in the neck and  supraclavicular region has regressed since starting the recent trial of  Revlimid.  LUNGS:  Clear and resonant to percussion.  Regular cardiac rhythm.  No  murmur or gallop.  ABDOMEN:  The abdomen is soft, obese, nontender.  No mass, no obvious  organomegaly.  EXTREMITIES: No edema.  No calf tenderness.  Port-A-Cath at the medial  aspect of the proximal right arm.  No tenderness or erythema.  NEUROLOGIC:  Mental status intact.  Cranial nerves intact.  Motor  strength 5/5.  Reflexes absent but symmetric.   LABORATORY DATA:  Most recent CBC done in our office this week on  07/17/2006 with hemoglobin 9.8, hematocrit 32%, white count 11,000, 100%  lymphocytes, zero neutrophils, platelet count 254,000.   IMPRESSION:  1. Chronic lymphocytic leukemia, becoming increasingly refractory to      treatment.  2. Fever and absolute granulocytopenia despite outpatient oral      antibiotics.  3. History of left lower extremity DVT on chronic Coumadin.   PLAN:  The plan is to admit for cultures and broad-spectrum parenteral  antibiotics and continue prophylactic oral antibiotics to protect  against PCP and herpes viruses.      Genene Churn. Cyndie Chime, M.D.  Electronically Signed     JMG/MEDQ  D:  07/22/2006  T:  07/22/2006  Job:  [id number]   cc:   Eartha Inch, MD

## 2010-07-05 NOTE — Discharge Summary (Signed)
[name], [name]                 ACCOUNT NO.:  [id number]   MEDICAL RECORD NO.:  [id number]          PATIENT TYPE:  INP   LOCATION:  1435                         FACILITY:  [facility]   PHYSICIAN:  Isidor Holts, M.D.  DATE OF BIRTH:  September 05, 1940   DATE OF ADMISSION:  12/13/2007  DATE OF DISCHARGE:  12/14/2007                               DISCHARGE SUMMARY   PRIMARY MEDICAL DOCTOR:  [name].   PRIMARY ONCOLOGIST:  [name].   DISCHARGE DIAGNOSES:  1. Atypical chest pain.  2. History of chronic lymphocytic leukemia.  3. Morbid obesity.  4. Degenerative joint disease.  5. History of left leg deep venous thrombosis, November 2002.   DISCHARGE MEDICATIONS:  1. Aspirin 81 mg p.o. daily.  2. Plavix 75 mg p.o. daily.  3. Metoprolol 25 mg p.o. b.i.d.  4. Simvastatin 20 mg p.o. nightly.  5. Prilosec 20 mg p.o. daily.   PROCEDURES:  1. Chest x-ray dated December 13, 2007, this showed no acute      cardiopulmonary disease.  2. Chest CT angiogram dated December 13, 2007, this showed no evidence      of acute pulmonary embolism.  There was interval increase in      bilateral bulky lymphadenopathy.  Two small new pulmonary nodules      in lingula, recommend attention on follow-up.   CONSULTATIONS:  [name], cardiologist.   ADMISSION HISTORY:  As in H and P notes of December 13, 2007, dictated by  [name].  However, in brief, this is a 70 year old  female, with known history of chronic lymphocytic leukemia, status post  multiple rounds of chemotherapy, left lower extremity DVT in 2002,  completed Coumadin therapy in December 2008, obesity, status post  hysterectomy, degenerative joint disease, history of dyslipidemia,  presenting with chest pain.  The patient described her symptoms starting  as a squeezing in the left arm which radiated from her arm into the left  lower chest.  This was associated with diaphoresis and nausea and then  eventually water brash.  No associated shortness of breath.  The  symptoms were transient, lasting about 10 minutes, but about half an  hour later it recurred, then the patient came to the emergency  department.  She was subsequently admitted for further evaluation,  investigation and management.   CLINICAL COURSE:  1. Chest pain.  This had atypical features.  However, the patient in      view of her age, obesity and remote history of dyslipidemia,      appears to have some risk factors for coronary artery disease.  A      12-lead EKG showed no acute ischemic changes.  Cardiac enzymes      remain unelevated.  The patient was placed on telemetric      monitoring, had no arrhythmias during the course of her      hospitalization.  Given a background history of previous DVT and a      history of hematologic malignancy, there was a high index of      suspicion for  pulmonary embolism.  She therefore underwent chest CT      angiogram on December 13, 2007, which showed no evidence of PE.  For      details of findings, refer to procedure list above.  Cardiology      consultation was kindly provided by [name], who has      recommended stress testing.  This will be carried out per patient's      preference, on an outpatient basis.  He recommended placing the      patient on Aspirin, Plavix beta-blocker and Simvastatin.  The      patient was empirically placed on proton pump inhibitor, and during      the course of her hospitalization she remained asymptomatic.   1. History of chronic lymphocytic leukemia.  This appears to be in      remission.  The patient continues to follow up with [name] and apparently completed her chemotherapy in In April      2009.  Chest CT angiogram did show interval increase in bilateral      lymphadenopathy.  However, we shall defer follow-up to [name].   1. Dyslipidemia.  The patient states that she was not aware that she       was dyslipidemic.  However, review of electronic medical records as      far back as November 2005, indicate this as part of her diagnosis.      Certainly given her obesity, she is at risk for this condition.      Her thyroid function showed TSH of 1.346.  Fasting lipid profile      was done during the course of this hospitalization, however,      results were not yet available at the time of this dictation.  The      patient, however, has been commenced on Simvastatin per      recommendation of cardiologist.   DISPOSITION:  The patient was on December 14, 2007, asymptomatic, and  clinically stable for discharge for outpatient workup.  She was  therefore discharged accordingly.   DIET:  Heart-healthy.   ACTIVITY:  Recommended to increase activity slowly.   FOLLOW-UP INSTRUCTIONS:  The patient is instructed to follow up with Dr.  Algie Coffer, cardiologist in 3-4 weeks, telephone number [phone], [address] [place], [place].  She has been instructed  to call for an appointment in a.m. of December 16, 2007.  A stress test  will be arranged at [name] discretion.  In addition, she is to  follow up routinely with her primary MD, [name] per prior  scheduled appointment with her primary oncologist, [name]  per prior scheduled appointment.   SPECIAL INSTRUCTIONS:  The patient has been instructed to decrease her  dietary intake by 50% and walk approximately 10 minutes three times  daily.  She has also been instructed that if she should have any  activity that causes chest pain, shortness of breath, dizziness,  sweating or excessive weakness, she should stop this immediately and  contact her MD.  This has been communicated to the patient and she has  verbalized understanding.      Isidor Holts, M.D.  Electronically Signed     CO/MEDQ  D:  12/14/2007  T:  12/14/2007  Job:  [id number]   cc:   Genene Churn. Cyndie Chime, M.D.  Fax:  [phone]   Chrissie Noa  Oneta Rack, M.D.  Fax: [phone]   Ricki Rodriguez, M.D.  Fax: [phone]

## 2010-07-05 NOTE — H&P (Signed)
NAMEJAMIYLA, [name]                 ACCOUNT NO.:  [id number]   MEDICAL RECORD NO.:  [id number]          PATIENT TYPE:  INP   LOCATION:  1435                         [facility]ACILITY:  [facility]   PHYSICIAN:  Thomasenia Bottoms, MDDATE OF BIRTH:  [birth year]   DATE OF ADMISSION:  12/13/2007  DATE OF DISCHARGE:                              HISTORY & PHYSICAL   CHIEF COMPLAINT:  Chest pain.   HISTORY OF PRESENT ILLNESS:  [name] is a 70 year old woman with  history of long battle with chronic lymphocytic leukemia who presents  today with 10 minutes of pain while she was in bed.  The patient  describes squeezing the left arm which radiated from her arm into her  lower left chest.  This was associated with diaphoresis and nausea.  She  actually had hot water regurgitate up into her mouth.  No shortness of  breath, but she has never had anything like this, and it worried her.  The symptoms subsided on their own in about 10 minutes, but about half  an hour later, it all came back and happened again.  That is when she  came to the emergency department.  She has not had any fevers, no  shortness of breath, no cough.   Her past medical history is significant for CLL which she battled from  2001-2008.  She had what sounds like multiple rounds of chemotherapy.  Her last chemotherapy was in May, and she has been essentially cancer  free since then.  She does have a history of DVT and was on Coumadin  many years ago, history of hypertension, obesity, dyslipidemia, TAH and  BSO.   MEDICATIONS ON ARRIVAL:  None.   SOCIAL HISTORY:  She does not smoke cigarettes.  No alcohol.  No illicit  drugs.   FAMILY HISTORY:  Significant for no coronary artery disease.   REVIEW OF SYSTEMS:  CONSTITUTIONAL:  Appetite is excellent.  No fevers.  HEENT:  No headache.  She has had lots of sinus trouble and recently had  sinus surgery.  CARDIOVASCULAR:  Generally, she does not have trouble  with chest pains.  She does  have chronic mild swelling in her ankles.  RESPIRATORY:  No shortness of breath, no hemoptysis, no wheezing, no  cough.  GASTROINTESTINAL:  She has not vomited any blood, not seen any  blood in her stool.  She denies any chronic trouble with heartburn or  acid reflux.  She denies any diarrhea or constipation.  GENITOURINARY:  No hematuria.  MUSCULOSKELETAL:  No joint pains reported.  NEUROLOGIC:  No paresthesias.  All other systems reviewed and are negative except for  the lymphatic system - the patient reports she has relatively new  resurgence of lymph nodes in her neck.   VITALS:  On arrival, her temperature was 98, blood pressure 141/69,  pulse not recorded, respiratory rate 20, O2 saturations 97% on room air.  The patient was having pain when she arrived in the emergency department  but is currently pain free.   PHYSICAL EXAMINATION:  The patient is well-nourished, well-developed,  obese in no  acute distress.  Her HEENT exam is normocephalic, atraumatic.  Pupils are equal and  round.  Sclerae nonicteric.  Oral mucosa moist.  NECK:  Supple.  She does have palpable lymphadenopathy in her neck.  No  thyromegaly, no jugular venous distention.  Her cardiac exam is regular rate and rhythm.  No murmur.  No gallops.  No rubs.  Her lungs are clear to auscultation bilaterally.  No wheezes, no  rhonchi, no rales.  Her abdomen is soft, nontender, nondistended.  Normoactive bowel sounds.  No hepatosplenomegaly appreciated.  Her extremities reveal nonpitting edema of both ankles.  NEUROLOGICALLY:  She is alert and oriented x3.  She is a pleasant and  attentive and appropriate.  Her cranial nerves II-XII are intact  grossly.  She has 5/5 strength in her upper lower extremities.  Sensory  exam is grossly intact to light touch in her upper and lower  extremities.  Normal muscle tone and bulk.  MUSCULOSKELETAL EXAMINATION:  Reveals no effusions of her joints.  Range  of motion not tested.   SKIN:  Is intact with no open lesions or rashes.   DATA:  Her EKG reveals normal sinus rhythm with rate of 96.  She had  some nonspecific ST-T wave changes but no ST-segment elevation or  depression.  This EKG is not significantly changed from an EKG done in  August 2009.   ASSESSMENT:  Sodium 140, potassium 3.6, chloride 109, bicarb 21, BUN 12,  creatinine 0.9.  Hemoglobin 11.6, hematocrit 34.  Troponin is less than  0.05, CK-MB is less than 1.  The patient's chest x-ray reveals no acute  cardiopulmonary disease.   ASSESSMENT AND PLAN:  1. Atypical chest pain in a patient with few cardiac risk factors.  We      will rule her out for MI and would consider getting an outpatient      stress test.  Will start her on a proton pump inhibitor empirically      at this time, however.  2. History of chronic lymphocytic leukemia.  The patient has been      newly resurgence palpable lymph nodes in her neck.  Will go ahead      and check a full CBC and will notify the patient's oncologist, Dr.      Cyndie Chime, of her admission.      Thomasenia Bottoms, MD  Electronically Signed     CVC/MEDQ  D:  12/13/2007  T:  12/13/2007  Job:  [id number]   cc:   Lucky Cowboy, M.D.  Fax: [phone]   Genene Churn. Cyndie Chime, M.D.  Fax: [phone]

## 2010-07-05 NOTE — Discharge Summary (Signed)
NAMEKATHLINE, [name]                 ACCOUNT NO.:  [id number]   MEDICAL RECORD NO.:  [id number]          PATIENT TYPE:  INP   LOCATION:  1325                         FACILITY:  [facility]   PHYSICIAN:  Genene Churn. 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   HISTORY OF PRESENT ILLNESS:  [name] is 70 year old woman with a  longstanding history of chronic lymphocytic leukemia initially diagnosed  December 2001.  She progressed on initial fludarabine base treatment.  She did not tolerate Rituxan.  She has been treated intermittently with  Campath (anti-CD 52 antibody) given initially in April 2005, a second  course December 2006 and a third course in February 2008, each time with  a response.  However, despite a dramatic fall in her white count and  stabilization of hemoglobin and platelets, she developed progressive  peripheral and central lymphadenopathy.  She was recently started on a  trial of Revlimid 5 mg daily, 21 days on followed by a 7 day break,  beginning on Jun 25, 2006.  Revlimid was discontinued on Jul 07, 2006,  when she developed rapid onset of neutropenia and a subsequent pustular  rash on her face and bib area.  She was started on oral ciprofloxacin  prophylaxis in addition to chronic PCP prophylaxis with Septra and viral  prophylaxis with Valtrex.   [name] called on July 22, 2006, to report a 24-hour history of a low  grade fever and shaking chills.  Her temperature had increased to 101  degrees.  She was subsequently admitted for further evaluation and  treatment.   Admission vital signs showed temperature 99.9, heart rate 100,  respirations 20, blood pressure 115/67, oxygen saturation 100% on room  air.  Admission  physical examination per Dr. Cyndie Chime - HEENT: Pupils  equal and reactive to light.  Pharynx without erythema or exudate.  There was a minimal residual pustular rash around her nose which had  improved greatly since she was seen in the office for the rash last  week.  Neck - supple.  No thyromegaly.  Adenopathy in the neck and  supraclavicular region had regressed since beginning the recent trial of  Revlimid.  CHEST - lungs clear and resonant to percussion.  Cardiovascular - regular cardiac rhythm.  No murmur or gallop.  Abdomen  - soft, obese, nontender.  No mass.  No obvious organomegaly.  Extremities - no edema.  No calf tenderness.  Port-A-Cath at the medial  aspect of the proximal right arm was without tenderness or erythema.  Neuro - mental status intact.  Cranial nerves intact.  Motor strength  5/5.  Reflexes absent but symmetric.   HOSPITAL COURSE:  [name] was admitted to the oncology unit at [facility] on July 22, 2006, for further evaluation/treatment of a  fever in the setting of severe neutropenia.  Admission  lab work showed  hemoglobin 9.1, white count 7.4, absolute neutrophil count 0.2, platelet  count 260,000, PT 16.9, INR 1.3, sodium 39, potassium 3.8, chloride 107,  CO2 26, glucose 115, BUN 8, creatinine 0.7, bilirubin 0.7, alkaline  phosphatase 87, SGOT 11, SGPT 10, total protein 6.3, albumin 3.4,  calcium 9.3, LDH 171.  Urinalysis was unremarkable.  Two sets of blood  cultures were obtained, one central and one peripheral.  A urine culture  was also obtained.  Chest x-ray showed no acute cardiopulmonary process.  IV antibiotic therapy was initiated with Elita Quick 2 grams IV every 8  hours.  Valtrex and Septra prophylaxis were continued.  Both sets of  blood cultures remained sterile.  The urine culture showed multiple  bacterial morphotypes, none predominant.  [name]'s maximum  temperature during the hospitalization was 100.1 degrees on July 24, 2006.   Temperature the morning of discharge was 99.4.  She was felt to  be stable for discharge home on oral ciprofloxacin 500 mg twice daily on  July 25, 2006.  She will continue Septra and Valtrex prophylaxis as prior  to this admission.   [name] complained of gum soreness during the hospitalization.  Gums  were noted to be erythematous.  There were no areas of ulceration.  We  will make an endodental referral after discharge.   [name] has a history of a lower extremity DVT.  She is maintained on  Coumadin.  The Coumadin was continued at her home dose of 10 mg daily  throughout the hospitalization.  She will continue the same dose at  discharge.   On July 25, 2006, [name] was felt to be stable for discharge home.  All cultures were negative.  She will complete a course of Cipro as an  outpatient.   LABORATORY DATA:  Laboratory data July 25, 2006:  Hemoglobin 9.1, white  count 4.1, absolute neutrophil count 0.1, platelet count 244,000, PT  17.3, INR 1.4.   CONSULTATIONS:  None.   PROCEDURES:  IV antibiotics.   DISPOSITION AT DISCHARGE:  1. Condition - stable.  2. Activity no restrictions.  3. Diet no restrictions.  4. Wound care - routine care of Port-A-Cath.  5. Special instructions:  Call for recurrent fever or other signs of      infection.  6. Followup:  Dr. Cyndie Chime as scheduled.   DISCHARGE MEDICATIONS:  Septra DS 1 tablet twice daily on Mondays,  Wednesdays and Fridays, Valtrex 500 mg daily, Cipro 500 mg twice daily,  Peridex oral rinse 4 times daily, Coumadin 10 mg daily, Restoril 30 mg  at bedtime as needed for sleep.   DISCHARGE DIAGNOSES:  1. Culture negative for neutropenia/fever.  2. Resistant chronic lymphocytic leukemia.  3. Gingivitis.  4. History of deep venous thrombosis, on chronic Coumadin      anticoagulation.  5. Degenerative joint disease.  6. Morbid obesity.      Lonna Cobb, N.P.      Genene Churn. Cyndie Chime, M.D. Electronically  Signed    LT/MEDQ  D:  07/25/2006  T:  07/25/2006  Job:  [id number]   cc:   Lucky Cowboy, M.D.  Fax: [phone]

## 2010-07-06 ENCOUNTER — Encounter (HOSPITAL_BASED_OUTPATIENT_CLINIC_OR_DEPARTMENT_OTHER): Payer: Medicare Other | Admitting: Oncology

## 2010-07-06 ENCOUNTER — Other Ambulatory Visit: Payer: Self-pay | Admitting: Oncology

## 2010-07-06 DIAGNOSIS — Z7901 Long term (current) use of anticoagulants: Secondary | ICD-10-CM

## 2010-07-06 DIAGNOSIS — I8000 Phlebitis and thrombophlebitis of superficial vessels of unspecified lower extremity: Secondary | ICD-10-CM

## 2010-07-06 DIAGNOSIS — Z5112 Encounter for antineoplastic immunotherapy: Secondary | ICD-10-CM

## 2010-07-06 DIAGNOSIS — C9110 Chronic lymphocytic leukemia of B-cell type not having achieved remission: Secondary | ICD-10-CM

## 2010-07-06 DIAGNOSIS — Z86718 Personal history of other venous thrombosis and embolism: Secondary | ICD-10-CM

## 2010-07-06 LAB — CBC WITH DIFFERENTIAL/PLATELET
Basophils Absolute: 0 10*3/uL (ref 0.0–0.1)
Eosinophils Absolute: 0 10*3/uL (ref 0.0–0.5)
HGB: 8.8 g/dL — ABNORMAL LOW (ref 11.6–15.9)
LYMPH%: 80.7 % — ABNORMAL HIGH (ref 14.0–49.7)
MCV: 72.1 fL — ABNORMAL LOW (ref 79.5–101.0)
MONO#: 0.1 10*3/uL (ref 0.1–0.9)
MONO%: 3.7 % (ref 0.0–14.0)
NEUT#: 0.4 10*3/uL — CL (ref 1.5–6.5)
Platelets: 137 10*3/uL — ABNORMAL LOW (ref 145–400)
RBC: 3.94 10*6/uL (ref 3.70–5.45)
RDW: 17.2 % — ABNORMAL HIGH (ref 11.2–14.5)
WBC: 2.7 10*3/uL — ABNORMAL LOW (ref 3.9–10.3)
nRBC: 0 % (ref 0–0)

## 2010-07-06 LAB — TECHNOLOGIST REVIEW

## 2010-07-06 LAB — PROTIME-INR: Protime: 31.2 Seconds — ABNORMAL HIGH (ref 10.6–13.4)

## 2010-07-13 ENCOUNTER — Encounter (HOSPITAL_BASED_OUTPATIENT_CLINIC_OR_DEPARTMENT_OTHER): Payer: Medicare Other | Admitting: Oncology

## 2010-07-13 ENCOUNTER — Other Ambulatory Visit: Payer: Self-pay | Admitting: Oncology

## 2010-07-13 DIAGNOSIS — Z7901 Long term (current) use of anticoagulants: Secondary | ICD-10-CM

## 2010-07-13 DIAGNOSIS — C9110 Chronic lymphocytic leukemia of B-cell type not having achieved remission: Secondary | ICD-10-CM

## 2010-07-13 DIAGNOSIS — Z5112 Encounter for antineoplastic immunotherapy: Secondary | ICD-10-CM

## 2010-07-13 DIAGNOSIS — Z5111 Encounter for antineoplastic chemotherapy: Secondary | ICD-10-CM

## 2010-07-13 DIAGNOSIS — I8000 Phlebitis and thrombophlebitis of superficial vessels of unspecified lower extremity: Secondary | ICD-10-CM

## 2010-07-13 DIAGNOSIS — Z86718 Personal history of other venous thrombosis and embolism: Secondary | ICD-10-CM

## 2010-07-13 LAB — COMPREHENSIVE METABOLIC PANEL
ALT: 23 U/L (ref 0–35)
AST: 16 U/L (ref 0–37)
Albumin: 3.2 g/dL — ABNORMAL LOW (ref 3.5–5.2)
Alkaline Phosphatase: 211 U/L — ABNORMAL HIGH (ref 39–117)
Glucose, Bld: 143 mg/dL — ABNORMAL HIGH (ref 70–99)
Potassium: 3.1 mEq/L — ABNORMAL LOW (ref 3.5–5.3)
Sodium: 139 mEq/L (ref 135–145)
Total Bilirubin: 0.5 mg/dL (ref 0.3–1.2)
Total Protein: 4.7 g/dL — ABNORMAL LOW (ref 6.0–8.3)

## 2010-07-13 LAB — CBC WITH DIFFERENTIAL/PLATELET
BASO%: 0.6 % (ref 0.0–2.0)
EOS%: 1.6 % (ref 0.0–7.0)
HCT: 26.8 % — ABNORMAL LOW (ref 34.8–46.6)
MCH: 22.5 pg — ABNORMAL LOW (ref 25.1–34.0)
MCHC: 30.6 g/dL — ABNORMAL LOW (ref 31.5–36.0)
MONO#: 0.4 10*3/uL (ref 0.1–0.9)
RBC: 3.65 10*6/uL — ABNORMAL LOW (ref 3.70–5.45)
RDW: 18 % — ABNORMAL HIGH (ref 11.2–14.5)
WBC: 3.2 10*3/uL — ABNORMAL LOW (ref 3.9–10.3)
lymph#: 2.3 10*3/uL (ref 0.9–3.3)
nRBC: 0 % (ref 0–0)

## 2010-07-13 LAB — PROTIME-INR: INR: 1.7 — ABNORMAL LOW (ref 2.00–3.50)

## 2010-07-13 LAB — TECHNOLOGIST REVIEW

## 2010-07-14 ENCOUNTER — Other Ambulatory Visit (HOSPITAL_COMMUNITY): Payer: Medicare Other

## 2010-07-20 ENCOUNTER — Encounter (HOSPITAL_BASED_OUTPATIENT_CLINIC_OR_DEPARTMENT_OTHER): Payer: Medicare Other | Admitting: Oncology

## 2010-07-20 ENCOUNTER — Other Ambulatory Visit: Payer: Self-pay | Admitting: Oncology

## 2010-07-20 DIAGNOSIS — C9110 Chronic lymphocytic leukemia of B-cell type not having achieved remission: Secondary | ICD-10-CM

## 2010-07-20 DIAGNOSIS — Z86718 Personal history of other venous thrombosis and embolism: Secondary | ICD-10-CM

## 2010-07-20 DIAGNOSIS — Z7901 Long term (current) use of anticoagulants: Secondary | ICD-10-CM

## 2010-07-20 LAB — PROTIME-INR
INR: 1.3 — ABNORMAL LOW (ref 2.00–3.50)
Protime: 15.6 Seconds — ABNORMAL HIGH (ref 10.6–13.4)

## 2010-07-20 LAB — BASIC METABOLIC PANEL
BUN: 8 mg/dL (ref 6–23)
Calcium: 10.4 mg/dL (ref 8.4–10.5)
Creatinine, Ser: 0.68 mg/dL (ref 0.40–1.20)
Potassium: 3.8 mEq/L (ref 3.5–5.3)

## 2010-07-20 LAB — CBC WITH DIFFERENTIAL/PLATELET
Basophils Absolute: 0 10*3/uL (ref 0.0–0.1)
EOS%: 1 % (ref 0.0–7.0)
HGB: 9.6 g/dL — ABNORMAL LOW (ref 11.6–15.9)
MCH: 23.4 pg — ABNORMAL LOW (ref 25.1–34.0)
MCV: 74 fL — ABNORMAL LOW (ref 79.5–101.0)
MONO%: 5 % (ref 0.0–14.0)
RDW: 19.6 % — ABNORMAL HIGH (ref 11.2–14.5)

## 2010-07-21 ENCOUNTER — Ambulatory Visit (HOSPITAL_COMMUNITY)
Admission: RE | Admit: 2010-07-21 | Discharge: 2010-07-21 | Disposition: A | Discharge status: Home / Self Care | Payer: Medicare Other | Source: Ambulatory Visit | Attending: Oncology | Admitting: Oncology

## 2010-07-21 DIAGNOSIS — R599 Enlarged lymph nodes, unspecified: Secondary | ICD-10-CM | POA: Insufficient documentation

## 2010-07-21 DIAGNOSIS — C9110 Chronic lymphocytic leukemia of B-cell type not having achieved remission: Secondary | ICD-10-CM | POA: Insufficient documentation

## 2010-07-21 MED ORDER — IOHEXOL 300 MG/ML  SOLN: 100.0000 mL | Freq: Once | INTRAMUSCULAR | Status: AC | PRN

## 2010-07-27 ENCOUNTER — Other Ambulatory Visit: Payer: Self-pay | Admitting: Oncology

## 2010-07-27 ENCOUNTER — Encounter (HOSPITAL_BASED_OUTPATIENT_CLINIC_OR_DEPARTMENT_OTHER): Payer: Medicare Other | Admitting: Oncology

## 2010-07-27 DIAGNOSIS — C9110 Chronic lymphocytic leukemia of B-cell type not having achieved remission: Secondary | ICD-10-CM

## 2010-07-27 DIAGNOSIS — Z86718 Personal history of other venous thrombosis and embolism: Secondary | ICD-10-CM

## 2010-07-27 DIAGNOSIS — Z7901 Long term (current) use of anticoagulants: Secondary | ICD-10-CM

## 2010-07-27 LAB — CBC WITH DIFFERENTIAL/PLATELET
EOS%: 1.6 % (ref 0.0–7.0)
MCH: 23.4 pg — ABNORMAL LOW (ref 25.1–34.0)
MCHC: 31.8 g/dL (ref 31.5–36.0)
MCV: 73.8 fL — ABNORMAL LOW (ref 79.5–101.0)
MONO%: 9.7 % (ref 0.0–14.0)
NEUT#: 0.5 10*3/uL — ABNORMAL LOW (ref 1.5–6.5)
RBC: 3.73 10*6/uL (ref 3.70–5.45)
RDW: 19.5 % — ABNORMAL HIGH (ref 11.2–14.5)

## 2010-07-27 LAB — PROTIME-INR
INR: 1.5 — ABNORMAL LOW (ref 2.00–3.50)
Protime: 18 Seconds — ABNORMAL HIGH (ref 10.6–13.4)

## 2010-07-28 ENCOUNTER — Ambulatory Visit (HOSPITAL_COMMUNITY)
Admission: RE | Admit: 2010-07-28 | Discharge: 2010-07-28 | Disposition: A | Discharge status: Home / Self Care | Payer: Medicare Other | Source: Ambulatory Visit | Attending: Oncology | Admitting: Oncology

## 2010-07-28 DIAGNOSIS — M79609 Pain in unspecified limb: Secondary | ICD-10-CM

## 2010-07-28 DIAGNOSIS — M7989 Other specified soft tissue disorders: Secondary | ICD-10-CM | POA: Insufficient documentation

## 2010-07-29 ENCOUNTER — Encounter (HOSPITAL_BASED_OUTPATIENT_CLINIC_OR_DEPARTMENT_OTHER): Payer: Medicare Other | Admitting: Oncology

## 2010-07-29 DIAGNOSIS — C9110 Chronic lymphocytic leukemia of B-cell type not having achieved remission: Secondary | ICD-10-CM

## 2010-07-29 DIAGNOSIS — Z7901 Long term (current) use of anticoagulants: Secondary | ICD-10-CM

## 2010-07-29 DIAGNOSIS — Z86718 Personal history of other venous thrombosis and embolism: Secondary | ICD-10-CM

## 2010-07-30 ENCOUNTER — Encounter (HOSPITAL_BASED_OUTPATIENT_CLINIC_OR_DEPARTMENT_OTHER): Payer: Medicare Other | Admitting: Oncology

## 2010-07-30 DIAGNOSIS — C9110 Chronic lymphocytic leukemia of B-cell type not having achieved remission: Secondary | ICD-10-CM

## 2010-07-30 DIAGNOSIS — Z7901 Long term (current) use of anticoagulants: Secondary | ICD-10-CM

## 2010-07-30 DIAGNOSIS — Z86718 Personal history of other venous thrombosis and embolism: Secondary | ICD-10-CM

## 2010-07-31 ENCOUNTER — Encounter (HOSPITAL_BASED_OUTPATIENT_CLINIC_OR_DEPARTMENT_OTHER): Payer: Medicare Other | Admitting: Oncology

## 2010-07-31 DIAGNOSIS — C9110 Chronic lymphocytic leukemia of B-cell type not having achieved remission: Secondary | ICD-10-CM

## 2010-07-31 DIAGNOSIS — Z86718 Personal history of other venous thrombosis and embolism: Secondary | ICD-10-CM

## 2010-07-31 DIAGNOSIS — Z7901 Long term (current) use of anticoagulants: Secondary | ICD-10-CM

## 2010-08-01 ENCOUNTER — Encounter (HOSPITAL_BASED_OUTPATIENT_CLINIC_OR_DEPARTMENT_OTHER): Payer: Medicare Other | Admitting: Oncology

## 2010-08-01 ENCOUNTER — Other Ambulatory Visit: Payer: Self-pay | Admitting: Oncology

## 2010-08-01 DIAGNOSIS — I8000 Phlebitis and thrombophlebitis of superficial vessels of unspecified lower extremity: Secondary | ICD-10-CM

## 2010-08-01 DIAGNOSIS — Z7901 Long term (current) use of anticoagulants: Secondary | ICD-10-CM

## 2010-08-01 DIAGNOSIS — Z86718 Personal history of other venous thrombosis and embolism: Secondary | ICD-10-CM

## 2010-08-01 DIAGNOSIS — C9110 Chronic lymphocytic leukemia of B-cell type not having achieved remission: Secondary | ICD-10-CM

## 2010-08-01 LAB — PROTIME-INR
INR: 2.2 (ref 2.00–3.50)
Protime: 26.4 Seconds — ABNORMAL HIGH (ref 10.6–13.4)

## 2010-08-08 ENCOUNTER — Other Ambulatory Visit: Payer: Self-pay | Admitting: Oncology

## 2010-08-08 ENCOUNTER — Encounter (HOSPITAL_BASED_OUTPATIENT_CLINIC_OR_DEPARTMENT_OTHER): Payer: Medicare Other | Admitting: Oncology

## 2010-08-08 DIAGNOSIS — Z86718 Personal history of other venous thrombosis and embolism: Secondary | ICD-10-CM

## 2010-08-08 DIAGNOSIS — C9110 Chronic lymphocytic leukemia of B-cell type not having achieved remission: Secondary | ICD-10-CM

## 2010-08-08 DIAGNOSIS — Z7901 Long term (current) use of anticoagulants: Secondary | ICD-10-CM

## 2010-08-08 LAB — PROTIME-INR: Protime: 30 Seconds — ABNORMAL HIGH (ref 10.6–13.4)

## 2010-08-17 ENCOUNTER — Other Ambulatory Visit: Payer: Self-pay | Admitting: Oncology

## 2010-08-17 ENCOUNTER — Encounter (HOSPITAL_BASED_OUTPATIENT_CLINIC_OR_DEPARTMENT_OTHER): Payer: Medicare Other | Admitting: Oncology

## 2010-08-17 DIAGNOSIS — Z452 Encounter for adjustment and management of vascular access device: Secondary | ICD-10-CM

## 2010-08-17 DIAGNOSIS — Z86718 Personal history of other venous thrombosis and embolism: Secondary | ICD-10-CM

## 2010-08-17 DIAGNOSIS — C9110 Chronic lymphocytic leukemia of B-cell type not having achieved remission: Secondary | ICD-10-CM

## 2010-08-17 DIAGNOSIS — Z7901 Long term (current) use of anticoagulants: Secondary | ICD-10-CM

## 2010-08-17 LAB — CBC WITH DIFFERENTIAL/PLATELET
HCT: 29.3 % — ABNORMAL LOW (ref 34.8–46.6)
HGB: 8.6 g/dL — ABNORMAL LOW (ref 11.6–15.9)
MCH: 22.3 pg — ABNORMAL LOW (ref 25.1–34.0)
MCV: 76.1 fL — ABNORMAL LOW (ref 79.5–101.0)
Platelets: 161 10*3/uL (ref 145–400)
RDW: 18 % — ABNORMAL HIGH (ref 11.2–14.5)
WBC: 8.3 10*3/uL (ref 3.9–10.3)

## 2010-08-17 LAB — MANUAL DIFFERENTIAL
Band Neutrophils: 6 % (ref 0–10)
EOS: 0 % (ref 0–7)
LYMPH: 90 % — ABNORMAL HIGH (ref 14–49)
MONO: 2 % (ref 0–14)
Other Cell: 0 % (ref 0–0)
PROMYELO: 0 % (ref 0–0)
SEG: 2 % — ABNORMAL LOW (ref 38–77)
nRBC: 0 % (ref 0–0)

## 2010-09-06 ENCOUNTER — Other Ambulatory Visit: Payer: Self-pay | Admitting: Oncology

## 2010-09-06 ENCOUNTER — Encounter (HOSPITAL_BASED_OUTPATIENT_CLINIC_OR_DEPARTMENT_OTHER): Payer: Medicare Other | Admitting: Oncology

## 2010-09-06 DIAGNOSIS — I8000 Phlebitis and thrombophlebitis of superficial vessels of unspecified lower extremity: Secondary | ICD-10-CM

## 2010-09-06 DIAGNOSIS — Z7901 Long term (current) use of anticoagulants: Secondary | ICD-10-CM

## 2010-09-06 DIAGNOSIS — C9110 Chronic lymphocytic leukemia of B-cell type not having achieved remission: Secondary | ICD-10-CM

## 2010-09-06 DIAGNOSIS — Z86718 Personal history of other venous thrombosis and embolism: Secondary | ICD-10-CM

## 2010-09-06 LAB — PROTIME-INR
INR: 2.5 (ref 2.00–3.50)
Protime: 30 Seconds — ABNORMAL HIGH (ref 10.6–13.4)

## 2010-09-16 ENCOUNTER — Other Ambulatory Visit: Payer: Self-pay | Admitting: Oncology

## 2010-09-16 ENCOUNTER — Encounter (HOSPITAL_BASED_OUTPATIENT_CLINIC_OR_DEPARTMENT_OTHER): Payer: Medicare Other | Admitting: Oncology

## 2010-09-16 DIAGNOSIS — Z5181 Encounter for therapeutic drug level monitoring: Secondary | ICD-10-CM

## 2010-09-16 DIAGNOSIS — Z86718 Personal history of other venous thrombosis and embolism: Secondary | ICD-10-CM

## 2010-09-16 DIAGNOSIS — Z7901 Long term (current) use of anticoagulants: Secondary | ICD-10-CM

## 2010-09-16 DIAGNOSIS — Z5112 Encounter for antineoplastic immunotherapy: Secondary | ICD-10-CM

## 2010-09-16 DIAGNOSIS — C9110 Chronic lymphocytic leukemia of B-cell type not having achieved remission: Secondary | ICD-10-CM

## 2010-09-16 LAB — COMPREHENSIVE METABOLIC PANEL
AST: 20 U/L (ref 0–37)
Albumin: 4.1 g/dL (ref 3.5–5.2)
BUN: 10 mg/dL (ref 6–23)
Calcium: 10.3 mg/dL (ref 8.4–10.5)
Chloride: 109 mEq/L (ref 96–112)
Creatinine, Ser: 0.84 mg/dL (ref 0.50–1.10)
Glucose, Bld: 148 mg/dL — ABNORMAL HIGH (ref 70–99)
Potassium: 3.6 mEq/L (ref 3.5–5.3)

## 2010-09-16 LAB — CBC WITH DIFFERENTIAL/PLATELET
Basophils Absolute: 0.1 10*3/uL (ref 0.0–0.1)
EOS%: 1.4 % (ref 0.0–7.0)
Eosinophils Absolute: 0.2 10*3/uL (ref 0.0–0.5)
HCT: 30.6 % — ABNORMAL LOW (ref 34.8–46.6)
HGB: 9.8 g/dL — ABNORMAL LOW (ref 11.6–15.9)
MCH: 23.3 pg — ABNORMAL LOW (ref 25.1–34.0)
MCV: 73.2 fL — ABNORMAL LOW (ref 79.5–101.0)
MONO%: 9.1 % (ref 0.0–14.0)
NEUT#: 0.8 10*3/uL — ABNORMAL LOW (ref 1.5–6.5)
NEUT%: 6.2 % — ABNORMAL LOW (ref 38.4–76.8)
lymph#: 10.9 10*3/uL — ABNORMAL HIGH (ref 0.9–3.3)

## 2010-09-30 ENCOUNTER — Encounter (HOSPITAL_BASED_OUTPATIENT_CLINIC_OR_DEPARTMENT_OTHER): Payer: Medicare Other | Admitting: Oncology

## 2010-09-30 ENCOUNTER — Other Ambulatory Visit: Payer: Self-pay | Admitting: Oncology

## 2010-09-30 DIAGNOSIS — Z86718 Personal history of other venous thrombosis and embolism: Secondary | ICD-10-CM

## 2010-09-30 DIAGNOSIS — C9110 Chronic lymphocytic leukemia of B-cell type not having achieved remission: Secondary | ICD-10-CM

## 2010-09-30 DIAGNOSIS — Z7901 Long term (current) use of anticoagulants: Secondary | ICD-10-CM

## 2010-09-30 LAB — PROTIME-INR

## 2010-10-05 ENCOUNTER — Encounter (HOSPITAL_BASED_OUTPATIENT_CLINIC_OR_DEPARTMENT_OTHER): Payer: Medicare Other | Admitting: Oncology

## 2010-10-05 DIAGNOSIS — Z452 Encounter for adjustment and management of vascular access device: Secondary | ICD-10-CM

## 2010-10-05 DIAGNOSIS — C9110 Chronic lymphocytic leukemia of B-cell type not having achieved remission: Secondary | ICD-10-CM

## 2010-10-31 ENCOUNTER — Encounter (HOSPITAL_BASED_OUTPATIENT_CLINIC_OR_DEPARTMENT_OTHER): Payer: Medicare Other | Admitting: Oncology

## 2010-10-31 ENCOUNTER — Other Ambulatory Visit: Payer: Self-pay | Admitting: Oncology

## 2010-10-31 DIAGNOSIS — Z7901 Long term (current) use of anticoagulants: Secondary | ICD-10-CM

## 2010-10-31 DIAGNOSIS — R509 Fever, unspecified: Secondary | ICD-10-CM

## 2010-10-31 DIAGNOSIS — Z86718 Personal history of other venous thrombosis and embolism: Secondary | ICD-10-CM

## 2010-10-31 DIAGNOSIS — C9110 Chronic lymphocytic leukemia of B-cell type not having achieved remission: Secondary | ICD-10-CM

## 2010-10-31 LAB — COMPREHENSIVE METABOLIC PANEL
ALT: 16 U/L (ref 0–35)
Albumin: 4.1 g/dL (ref 3.5–5.2)
CO2: 20 mEq/L (ref 19–32)
Calcium: 10.2 mg/dL (ref 8.4–10.5)
Chloride: 107 mEq/L (ref 96–112)
Creatinine, Ser: 0.88 mg/dL (ref 0.50–1.10)
Potassium: 4 mEq/L (ref 3.5–5.3)

## 2010-10-31 LAB — CBC WITH DIFFERENTIAL/PLATELET
BASO%: 0.3 % (ref 0.0–2.0)
EOS%: 0.6 % (ref 0.0–7.0)
MCH: 23.3 pg — ABNORMAL LOW (ref 25.1–34.0)
MCHC: 32.1 g/dL (ref 31.5–36.0)
RBC: 3.96 10*6/uL (ref 3.70–5.45)
RDW: 16.8 % — ABNORMAL HIGH (ref 11.2–14.5)
lymph#: 27 10*3/uL — ABNORMAL HIGH (ref 0.9–3.3)

## 2010-10-31 LAB — PROTIME-INR: INR: 1.6 — ABNORMAL LOW (ref 2.00–3.50)

## 2010-10-31 LAB — MORPHOLOGY

## 2010-10-31 LAB — LACTATE DEHYDROGENASE: LDH: 602 U/L — ABNORMAL HIGH (ref 94–250)

## 2010-11-01 ENCOUNTER — Encounter (HOSPITAL_BASED_OUTPATIENT_CLINIC_OR_DEPARTMENT_OTHER): Payer: Medicare Other | Admitting: Oncology

## 2010-11-01 DIAGNOSIS — C9110 Chronic lymphocytic leukemia of B-cell type not having achieved remission: Secondary | ICD-10-CM

## 2010-11-01 DIAGNOSIS — Z86718 Personal history of other venous thrombosis and embolism: Secondary | ICD-10-CM

## 2010-11-01 DIAGNOSIS — Z7901 Long term (current) use of anticoagulants: Secondary | ICD-10-CM

## 2010-11-02 ENCOUNTER — Encounter (HOSPITAL_BASED_OUTPATIENT_CLINIC_OR_DEPARTMENT_OTHER): Payer: Medicare Other | Admitting: Oncology

## 2010-11-02 DIAGNOSIS — Z86718 Personal history of other venous thrombosis and embolism: Secondary | ICD-10-CM

## 2010-11-02 DIAGNOSIS — Z7901 Long term (current) use of anticoagulants: Secondary | ICD-10-CM

## 2010-11-03 ENCOUNTER — Encounter (HOSPITAL_BASED_OUTPATIENT_CLINIC_OR_DEPARTMENT_OTHER): Payer: Medicare Other | Admitting: Oncology

## 2010-11-03 DIAGNOSIS — C9110 Chronic lymphocytic leukemia of B-cell type not having achieved remission: Secondary | ICD-10-CM

## 2010-11-03 DIAGNOSIS — Z7901 Long term (current) use of anticoagulants: Secondary | ICD-10-CM

## 2010-11-03 DIAGNOSIS — Z86718 Personal history of other venous thrombosis and embolism: Secondary | ICD-10-CM

## 2010-11-04 ENCOUNTER — Encounter (HOSPITAL_BASED_OUTPATIENT_CLINIC_OR_DEPARTMENT_OTHER): Payer: Medicare Other | Admitting: Oncology

## 2010-11-04 ENCOUNTER — Other Ambulatory Visit: Payer: Self-pay | Admitting: Oncology

## 2010-11-04 DIAGNOSIS — Z86718 Personal history of other venous thrombosis and embolism: Secondary | ICD-10-CM

## 2010-11-04 DIAGNOSIS — Z7901 Long term (current) use of anticoagulants: Secondary | ICD-10-CM

## 2010-11-04 DIAGNOSIS — C9110 Chronic lymphocytic leukemia of B-cell type not having achieved remission: Secondary | ICD-10-CM

## 2010-11-04 LAB — PROTIME-INR

## 2010-11-10 ENCOUNTER — Other Ambulatory Visit: Payer: Self-pay | Admitting: Oncology

## 2010-11-10 ENCOUNTER — Encounter (HOSPITAL_BASED_OUTPATIENT_CLINIC_OR_DEPARTMENT_OTHER): Payer: Medicare Other | Admitting: Oncology

## 2010-11-10 DIAGNOSIS — Z7901 Long term (current) use of anticoagulants: Secondary | ICD-10-CM

## 2010-11-10 DIAGNOSIS — Z86718 Personal history of other venous thrombosis and embolism: Secondary | ICD-10-CM

## 2010-11-10 DIAGNOSIS — C9110 Chronic lymphocytic leukemia of B-cell type not having achieved remission: Secondary | ICD-10-CM

## 2010-11-10 DIAGNOSIS — I808 Phlebitis and thrombophlebitis of other sites: Secondary | ICD-10-CM

## 2010-11-10 DIAGNOSIS — Z452 Encounter for adjustment and management of vascular access device: Secondary | ICD-10-CM

## 2010-11-16 ENCOUNTER — Encounter (HOSPITAL_BASED_OUTPATIENT_CLINIC_OR_DEPARTMENT_OTHER): Payer: Medicare Other | Admitting: Oncology

## 2010-11-16 ENCOUNTER — Other Ambulatory Visit: Payer: Self-pay | Admitting: Oncology

## 2010-11-16 DIAGNOSIS — C9110 Chronic lymphocytic leukemia of B-cell type not having achieved remission: Secondary | ICD-10-CM

## 2010-11-16 DIAGNOSIS — Z86718 Personal history of other venous thrombosis and embolism: Secondary | ICD-10-CM

## 2010-11-16 DIAGNOSIS — Z7901 Long term (current) use of anticoagulants: Secondary | ICD-10-CM

## 2010-11-16 DIAGNOSIS — Z452 Encounter for adjustment and management of vascular access device: Secondary | ICD-10-CM

## 2010-11-16 LAB — PROTIME-INR
INR: 2.4 (ref 2.00–3.50)
Protime: 28.8 Seconds — ABNORMAL HIGH (ref 10.6–13.4)

## 2010-11-21 LAB — LIPID PANEL
Cholesterol: 186
HDL: 33 — ABNORMAL LOW

## 2010-11-21 LAB — BASIC METABOLIC PANEL
BUN: 11
Chloride: 106
Creatinine, Ser: 0.68
Glucose, Bld: 143 — ABNORMAL HIGH

## 2010-11-21 LAB — POCT I-STAT, CHEM 8
Calcium, Ion: 1.24
Hemoglobin: 11.6 — ABNORMAL LOW
Sodium: 140
TCO2: 21

## 2010-11-21 LAB — CBC
HCT: 30.4 — ABNORMAL LOW
HCT: 30.4 — ABNORMAL LOW
MCHC: 31.3
MCV: 69.7 — ABNORMAL LOW
MCV: 69.8 — ABNORMAL LOW
MCV: 70.5 — ABNORMAL LOW
Platelets: 186
Platelets: 190
Platelets: 200
RBC: 4.36
RBC: 4.72
RDW: 16.9 — ABNORMAL HIGH
WBC: 3.6 — ABNORMAL LOW
WBC: 3.7 — ABNORMAL LOW

## 2010-11-21 LAB — DIFFERENTIAL
Eosinophils Absolute: 0.3
Lymphs Abs: 1.5
Monocytes Absolute: 0.4
Neutrophils Relative %: 40 — ABNORMAL LOW

## 2010-11-21 LAB — POCT CARDIAC MARKERS
CKMB, poc: <1 — ABNORMAL LOW
Myoglobin, poc: 52.2

## 2010-11-21 LAB — TSH: TSH: 1.346

## 2010-11-25 ENCOUNTER — Encounter (HOSPITAL_BASED_OUTPATIENT_CLINIC_OR_DEPARTMENT_OTHER): Payer: Medicare Other | Admitting: Oncology

## 2010-11-25 ENCOUNTER — Other Ambulatory Visit: Payer: Self-pay | Admitting: Oncology

## 2010-11-25 DIAGNOSIS — Z7901 Long term (current) use of anticoagulants: Secondary | ICD-10-CM

## 2010-11-25 DIAGNOSIS — C9110 Chronic lymphocytic leukemia of B-cell type not having achieved remission: Secondary | ICD-10-CM

## 2010-11-25 DIAGNOSIS — Z86718 Personal history of other venous thrombosis and embolism: Secondary | ICD-10-CM

## 2010-11-25 LAB — PROTIME-INR
INR: 3.1 (ref 2.00–3.50)
Protime: 37.2 Seconds — ABNORMAL HIGH (ref 10.6–13.4)

## 2010-12-02 ENCOUNTER — Encounter (HOSPITAL_BASED_OUTPATIENT_CLINIC_OR_DEPARTMENT_OTHER): Payer: Medicare Other | Admitting: Oncology

## 2010-12-02 ENCOUNTER — Encounter (HOSPITAL_COMMUNITY)
Admission: RE | Admit: 2010-12-02 | Discharge: 2010-12-02 | Disposition: A | Discharge status: Home / Self Care | Payer: Medicare Other | Source: Ambulatory Visit | Attending: Oncology | Admitting: Oncology

## 2010-12-02 ENCOUNTER — Other Ambulatory Visit: Payer: Self-pay | Admitting: Oncology

## 2010-12-02 DIAGNOSIS — C9110 Chronic lymphocytic leukemia of B-cell type not having achieved remission: Secondary | ICD-10-CM | POA: Insufficient documentation

## 2010-12-02 DIAGNOSIS — Z5111 Encounter for antineoplastic chemotherapy: Secondary | ICD-10-CM

## 2010-12-02 DIAGNOSIS — Z86718 Personal history of other venous thrombosis and embolism: Secondary | ICD-10-CM

## 2010-12-02 DIAGNOSIS — Z7901 Long term (current) use of anticoagulants: Secondary | ICD-10-CM

## 2010-12-02 DIAGNOSIS — R509 Fever, unspecified: Secondary | ICD-10-CM

## 2010-12-02 LAB — MANUAL DIFFERENTIAL
ALC: 67.8 10*3/uL — ABNORMAL HIGH (ref 0.9–3.3)
ANC (CHCC manual diff): 2.1 10*3/uL (ref 1.5–6.5)
Band Neutrophils: 0 % (ref 0–10)
Basophil: 0 % (ref 0–2)
Blasts: 0 % (ref 0–0)
LYMPH: 95 % — ABNORMAL HIGH (ref 14–49)
PROMYELO: 0 % (ref 0–0)
SEG: 3 % — ABNORMAL LOW (ref 38–77)
Variant Lymph: 0 % (ref 0–0)
nRBC: 1 % — ABNORMAL HIGH (ref 0–0)

## 2010-12-02 LAB — DIFFERENTIAL
Basophils Absolute: 0.2 — ABNORMAL HIGH
Eosinophils Absolute: 2.9 — ABNORMAL HIGH
Lymphs Abs: 12.7 — ABNORMAL HIGH
Neutro Abs: 1.1 — ABNORMAL LOW

## 2010-12-02 LAB — CBC WITH DIFFERENTIAL/PLATELET
HCT: 24.7 % — ABNORMAL LOW (ref 34.8–46.6)
HGB: 7.6 g/dL — ABNORMAL LOW (ref 11.6–15.9)
RDW: 20.5 % — ABNORMAL HIGH (ref 11.2–14.5)
WBC: 71.4 10*3/uL (ref 3.9–10.3)

## 2010-12-02 LAB — CBC
HCT: 28.2 — ABNORMAL LOW
MCV: 75.8 — ABNORMAL LOW
Platelets: 125 — ABNORMAL LOW
RDW: 15.4 — ABNORMAL HIGH

## 2010-12-02 LAB — TYPE & CROSSMATCH - CHCC

## 2010-12-03 LAB — CROSSMATCH
ABO/RH(D): B POS
Antibody Screen: NEGATIVE
Unit division: 0
Unit division: 0

## 2010-12-05 LAB — CBC
HCT: 27.6 — ABNORMAL LOW
HCT: 28 — ABNORMAL LOW
HCT: 29.2 — ABNORMAL LOW
Hemoglobin: 8.9 — ABNORMAL LOW
Hemoglobin: 8.9 — ABNORMAL LOW
Hemoglobin: 9.5 — ABNORMAL LOW
MCHC: 32.1
MCHC: 32.6
MCV: 73.8 — ABNORMAL LOW
MCV: 75.4 — ABNORMAL LOW
Platelets: 124 — ABNORMAL LOW
Platelets: 130 — ABNORMAL LOW
RBC: 3.7 — ABNORMAL LOW
RBC: 3.71 — ABNORMAL LOW
RBC: 3.96
RDW: 15.9 — ABNORMAL HIGH
RDW: 16.3 — ABNORMAL HIGH
WBC: 17 — ABNORMAL HIGH
WBC: 8.1

## 2010-12-05 LAB — DIFFERENTIAL
Basophils Absolute: 0
Basophils Relative: 0
Basophils Relative: 0
Basophils Relative: 1
Eosinophils Absolute: 1.3 — ABNORMAL HIGH
Eosinophils Absolute: 1.4 — ABNORMAL HIGH
Eosinophils Relative: 13 — ABNORMAL HIGH
Eosinophils Relative: 18 — ABNORMAL HIGH
Lymphocytes Relative: 49 — ABNORMAL HIGH
Lymphocytes Relative: 49 — ABNORMAL HIGH
Lymphs Abs: 4.4 — ABNORMAL HIGH
Lymphs Abs: 5.8 — ABNORMAL HIGH
Monocytes Absolute: 0.7
Monocytes Absolute: 0.8 — ABNORMAL HIGH
Monocytes Relative: 30 — ABNORMAL HIGH
Monocytes Relative: 8
Monocytes Relative: 8
Neutro Abs: 1.2 — ABNORMAL LOW
Neutro Abs: 1.4 — ABNORMAL LOW
Neutrophils Relative %: 11 — ABNORMAL LOW
Neutrophils Relative %: 14 — ABNORMAL LOW
Neutrophils Relative %: 20 — ABNORMAL LOW

## 2010-12-05 LAB — URINE CULTURE
Colony Count: 8000
Special Requests: NEGATIVE

## 2010-12-05 LAB — PROTIME-INR
INR: 1.6 — ABNORMAL HIGH
INR: 2 — ABNORMAL HIGH
Prothrombin Time: 19.5 — ABNORMAL HIGH
Prothrombin Time: 23 — ABNORMAL HIGH

## 2010-12-05 LAB — BASIC METABOLIC PANEL
CO2: 24
CO2: 25
Calcium: 9
Chloride: 105
Chloride: 109
GFR calc Af Amer: >60
GFR calc Af Amer: >60
GFR calc Af Amer: >60
GFR calc non Af Amer: >60
GFR calc non Af Amer: >60
Glucose, Bld: 129 — ABNORMAL HIGH
Potassium: 3.3 — ABNORMAL LOW
Potassium: 3.5
Potassium: 4.1
Sodium: 137
Sodium: 138
Sodium: 140

## 2010-12-05 LAB — URINALYSIS, ROUTINE W REFLEX MICROSCOPIC
Glucose, UA: NEGATIVE
Ketones, ur: NEGATIVE
Leukocytes, UA: NEGATIVE
Protein, ur: NEGATIVE

## 2010-12-05 LAB — CYTOMEGALOVIRUS PCR, QUALITATIVE: Cytomegalovirus DNA: NOT DETECTED

## 2010-12-05 LAB — CULTURE, BLOOD (ROUTINE X 2): Culture: NO GROWTH

## 2010-12-05 LAB — CMV ABS, IGG+IGM (CYTOMEGALOVIRUS): Cytomegalovirus Ab-IgG: 11.37 Index — ABNORMAL HIGH (ref <0.80)

## 2010-12-05 LAB — COMPREHENSIVE METABOLIC PANEL
ALT: 23
AST: 20
Albumin: 3.2 — ABNORMAL LOW
Calcium: 8.9
GFR calc Af Amer: >60
Glucose, Bld: 119 — ABNORMAL HIGH
Sodium: 133 — ABNORMAL LOW
Total Protein: 6.9

## 2010-12-06 ENCOUNTER — Encounter (HOSPITAL_COMMUNITY): Payer: Medicare Other

## 2010-12-08 ENCOUNTER — Inpatient Hospital Stay (HOSPITAL_COMMUNITY): Payer: Medicare Other

## 2010-12-08 ENCOUNTER — Other Ambulatory Visit: Payer: Self-pay | Admitting: Hematology and Oncology

## 2010-12-08 ENCOUNTER — Inpatient Hospital Stay (HOSPITAL_COMMUNITY)
Admission: AD | Admit: 2010-12-08 | Discharge: 2010-12-14 | DRG: 841 | Disposition: A | Discharge status: Home / Self Care | Payer: Medicare Other | Source: Ambulatory Visit | Attending: Oncology | Admitting: Oncology

## 2010-12-08 ENCOUNTER — Encounter (HOSPITAL_BASED_OUTPATIENT_CLINIC_OR_DEPARTMENT_OTHER): Payer: Medicare Other | Admitting: Oncology

## 2010-12-08 DIAGNOSIS — R5081 Fever presenting with conditions classified elsewhere: Secondary | ICD-10-CM | POA: Diagnosis present

## 2010-12-08 DIAGNOSIS — T451X5A Adverse effect of antineoplastic and immunosuppressive drugs, initial encounter: Secondary | ICD-10-CM | POA: Diagnosis present

## 2010-12-08 DIAGNOSIS — D702 Other drug-induced agranulocytosis: Secondary | ICD-10-CM | POA: Diagnosis present

## 2010-12-08 DIAGNOSIS — R509 Fever, unspecified: Secondary | ICD-10-CM

## 2010-12-08 DIAGNOSIS — Z7901 Long term (current) use of anticoagulants: Secondary | ICD-10-CM

## 2010-12-08 DIAGNOSIS — E669 Obesity, unspecified: Secondary | ICD-10-CM | POA: Diagnosis present

## 2010-12-08 DIAGNOSIS — R350 Frequency of micturition: Secondary | ICD-10-CM | POA: Diagnosis present

## 2010-12-08 DIAGNOSIS — C9110 Chronic lymphocytic leukemia of B-cell type not having achieved remission: Principal | ICD-10-CM | POA: Diagnosis present

## 2010-12-08 DIAGNOSIS — E119 Type 2 diabetes mellitus without complications: Secondary | ICD-10-CM | POA: Diagnosis present

## 2010-12-08 DIAGNOSIS — Z86718 Personal history of other venous thrombosis and embolism: Secondary | ICD-10-CM

## 2010-12-08 DIAGNOSIS — R32 Unspecified urinary incontinence: Secondary | ICD-10-CM | POA: Diagnosis present

## 2010-12-08 DIAGNOSIS — I2782 Chronic pulmonary embolism: Secondary | ICD-10-CM | POA: Diagnosis present

## 2010-12-08 DIAGNOSIS — D630 Anemia in neoplastic disease: Secondary | ICD-10-CM | POA: Diagnosis present

## 2010-12-08 DIAGNOSIS — D709 Neutropenia, unspecified: Secondary | ICD-10-CM

## 2010-12-08 DIAGNOSIS — K5900 Constipation, unspecified: Secondary | ICD-10-CM | POA: Diagnosis present

## 2010-12-08 DIAGNOSIS — I82509 Chronic embolism and thrombosis of unspecified deep veins of unspecified lower extremity: Secondary | ICD-10-CM | POA: Diagnosis present

## 2010-12-08 LAB — URINALYSIS, ROUTINE W REFLEX MICROSCOPIC
Bilirubin Urine: NEGATIVE
Glucose, UA: NEGATIVE mg/dL
Ketones, ur: NEGATIVE
Leukocytes, UA: NEGATIVE
Nitrite: NEGATIVE
Nitrite: NEGATIVE
Specific Gravity, Urine: 1.014 (ref 1.005–1.030)
Urobilinogen, UA: 1
pH: 8 (ref 5.0–8.0)

## 2010-12-08 LAB — COMPREHENSIVE METABOLIC PANEL
ALT: 25 U/L (ref 0–35)
AST: 11
AST: 19 U/L (ref 0–37)
Albumin: 3.4 — ABNORMAL LOW
Albumin: 3.6 g/dL (ref 3.5–5.2)
CO2: 29 mEq/L (ref 19–32)
Calcium: 9.3
Chloride: 107
Chloride: 99 mEq/L (ref 96–112)
Creatinine, Ser: 0.7
Creatinine, Ser: 0.73 mg/dL (ref 0.50–1.10)
GFR calc Af Amer: >60
GFR calc non Af Amer: 85 mL/min — ABNORMAL LOW (ref >90)
Sodium: 139
Sodium: 137 mEq/L (ref 135–145)
Total Bilirubin: 0.5 mg/dL (ref 0.3–1.2)

## 2010-12-08 LAB — CBC
HCT: 28.1 — ABNORMAL LOW
HCT: 28.2 — ABNORMAL LOW
Hemoglobin: 9 — ABNORMAL LOW
Hemoglobin: 9.1 — ABNORMAL LOW
MCV: 76 — ABNORMAL LOW
MCV: 76.2 — ABNORMAL LOW
Platelets: 260
RBC: 3.71 — ABNORMAL LOW
RBC: 3.75 — ABNORMAL LOW
RBC: 3.78 — ABNORMAL LOW
RDW: 14.3 — ABNORMAL HIGH
WBC: 5
WBC: 6.1
WBC: 7.4

## 2010-12-08 LAB — MANUAL DIFFERENTIAL
ALC: 98.4 10*3/uL — ABNORMAL HIGH (ref 0.9–3.3)
ANC (CHCC manual diff): 0 10*3/uL — CL (ref 1.5–6.5)
Band Neutrophils: 0 % (ref 0–10)
Blasts: 0 % (ref 0–0)
EOS: 0 % (ref 0–7)
Myelocytes: 0 % (ref 0–0)
Other Cell: 0 % (ref 0–0)
PLT EST: ADEQUATE
PROMYELO: 0 % (ref 0–0)
SEG: 0 % — ABNORMAL LOW (ref 38–77)
Variant Lymph: 0 % (ref 0–0)
nRBC: 1 % — ABNORMAL HIGH (ref 0–0)

## 2010-12-08 LAB — URINE CULTURE: Colony Count: 70000

## 2010-12-08 LAB — DIFFERENTIAL
Basophils Absolute: 0
Basophils Absolute: 0
Basophils Relative: 0
Eosinophils Relative: 0
Eosinophils Relative: 0
Lymphocytes Relative: 92 — ABNORMAL HIGH
Lymphocytes Relative: 97 — ABNORMAL HIGH
Lymphocytes Relative: 97 — ABNORMAL HIGH
Lymphs Abs: 5.9 — ABNORMAL HIGH
Lymphs Abs: 7 — ABNORMAL HIGH
Monocytes Absolute: 0 — ABNORMAL LOW
Monocytes Absolute: 0.2
Monocytes Relative: 1 — ABNORMAL LOW
Monocytes Relative: 1 — ABNORMAL LOW
Monocytes Relative: 3
Neutro Abs: 0 — ABNORMAL LOW
Neutro Abs: 0.1 — ABNORMAL LOW
Neutro Abs: 0.2 — ABNORMAL LOW
Neutrophils Relative %: 3 — ABNORMAL LOW

## 2010-12-08 LAB — CULTURE, BLOOD (ROUTINE X 2)
Culture: NO GROWTH
Culture: NO GROWTH

## 2010-12-08 LAB — PROTIME-INR
INR: 1.3
INR: 1.4
INR: 1.4
INR: 2.41 — ABNORMAL HIGH (ref 0.00–1.49)
Prothrombin Time: 17.8 — ABNORMAL HIGH

## 2010-12-08 LAB — GLUCOSE, CAPILLARY
Glucose-Capillary: 163 mg/dL — ABNORMAL HIGH (ref 70–99)
Glucose-Capillary: 122 mg/dL — ABNORMAL HIGH (ref 70–99)

## 2010-12-08 LAB — CBC WITH DIFFERENTIAL/PLATELET
HGB: 10.2 g/dL — ABNORMAL LOW (ref 11.6–15.9)
MCV: 79 fL — ABNORMAL LOW (ref 79.5–101.0)
RDW: 19.1 % — ABNORMAL HIGH (ref 11.2–14.5)
WBC: 98.4 10*3/uL (ref 3.9–10.3)

## 2010-12-08 LAB — CYTOMEGALOVIRUS PCR, QUALITATIVE: Cytomegalovirus DNA: NOT DETECTED

## 2010-12-09 DIAGNOSIS — C9110 Chronic lymphocytic leukemia of B-cell type not having achieved remission: Secondary | ICD-10-CM

## 2010-12-09 LAB — URINALYSIS, ROUTINE W REFLEX MICROSCOPIC
Bilirubin Urine: NEGATIVE
Glucose, UA: NEGATIVE mg/dL
Hgb urine dipstick: NEGATIVE
Protein, ur: NEGATIVE mg/dL

## 2010-12-09 LAB — DIFFERENTIAL
Blasts: 0 %
Eosinophils Relative: 0 % (ref 0–5)
Metamyelocytes Relative: 0 %
Myelocytes: 0 %
Neutro Abs: 0 10*3/uL — ABNORMAL LOW (ref 1.7–7.7)
Neutrophils Relative %: 0 % — ABNORMAL LOW (ref 43–77)
nRBC: 0 /100 WBC

## 2010-12-09 LAB — URINE CULTURE: Culture  Setup Time: 201210190047

## 2010-12-09 LAB — GLUCOSE, CAPILLARY
Glucose-Capillary: 114 mg/dL — ABNORMAL HIGH (ref 70–99)
Glucose-Capillary: 157 mg/dL — ABNORMAL HIGH (ref 70–99)

## 2010-12-09 LAB — BASIC METABOLIC PANEL
BUN: 15 mg/dL (ref 6–23)
CO2: 29 mEq/L (ref 19–32)
Chloride: 99 mEq/L (ref 96–112)
GFR calc non Af Amer: 83 mL/min — ABNORMAL LOW (ref >90)
Glucose, Bld: 134 mg/dL — ABNORMAL HIGH (ref 70–99)
Potassium: 3.9 mEq/L (ref 3.5–5.1)
Sodium: 136 mEq/L (ref 135–145)

## 2010-12-09 LAB — CBC
HCT: 29.3 % — ABNORMAL LOW (ref 36.0–46.0)
Hemoglobin: 9 g/dL — ABNORMAL LOW (ref 12.0–15.0)
RBC: 3.7 MIL/uL — ABNORMAL LOW (ref 3.87–5.11)
WBC: 87.2 10*3/uL (ref 4.0–10.5)

## 2010-12-09 LAB — PROTIME-INR
INR: 1.55 — ABNORMAL HIGH (ref 0.00–1.49)
Prothrombin Time: 18.9 seconds — ABNORMAL HIGH (ref 11.6–15.2)

## 2010-12-10 LAB — DIFFERENTIAL
Basophils Relative: 0 % (ref 0–1)
Eosinophils Absolute: 0 10*3/uL (ref 0.0–0.7)
Monocytes Absolute: 0 10*3/uL — ABNORMAL LOW (ref 0.1–1.0)
Neutro Abs: 0 10*3/uL — ABNORMAL LOW (ref 1.7–7.7)
Neutrophils Relative %: 0 % — ABNORMAL LOW (ref 43–77)

## 2010-12-10 LAB — CBC
HCT: 27 % — ABNORMAL LOW (ref 36.0–46.0)
MCHC: 30.7 g/dL (ref 30.0–36.0)
MCV: 80.1 fL (ref 78.0–100.0)
RDW: 19.5 % — ABNORMAL HIGH (ref 11.5–15.5)

## 2010-12-10 LAB — URINALYSIS, ROUTINE W REFLEX MICROSCOPIC
Ketones, ur: NEGATIVE mg/dL
Leukocytes, UA: NEGATIVE
Nitrite: NEGATIVE
pH: 6.5 (ref 5.0–8.0)

## 2010-12-10 LAB — BASIC METABOLIC PANEL
CO2: 27 mEq/L (ref 19–32)
Calcium: 9.5 mg/dL (ref 8.4–10.5)
Creatinine, Ser: 0.73 mg/dL (ref 0.50–1.10)
Glucose, Bld: 140 mg/dL — ABNORMAL HIGH (ref 70–99)

## 2010-12-10 LAB — GLUCOSE, CAPILLARY
Glucose-Capillary: 149 mg/dL — ABNORMAL HIGH (ref 70–99)
Glucose-Capillary: 144 mg/dL — ABNORMAL HIGH (ref 70–99)

## 2010-12-11 DIAGNOSIS — C9110 Chronic lymphocytic leukemia of B-cell type not having achieved remission: Secondary | ICD-10-CM

## 2010-12-11 LAB — DIFFERENTIAL
Basophils Absolute: 0 10*3/uL (ref 0.0–0.1)
Basophils Relative: 0 % (ref 0–1)
Myelocytes: 0 %
Neutro Abs: 0 10*3/uL — ABNORMAL LOW (ref 1.7–7.7)
Neutrophils Relative %: 0 % — ABNORMAL LOW (ref 43–77)
Promyelocytes Absolute: 0 %
nRBC: 0 /100 WBC

## 2010-12-11 LAB — GLUCOSE, CAPILLARY
Glucose-Capillary: 135 mg/dL — ABNORMAL HIGH (ref 70–99)
Glucose-Capillary: 118 mg/dL — ABNORMAL HIGH (ref 70–99)
Glucose-Capillary: 119 mg/dL — ABNORMAL HIGH (ref 70–99)

## 2010-12-11 LAB — CBC
HCT: 26 % — ABNORMAL LOW (ref 36.0–46.0)
Platelets: 124 10*3/uL — ABNORMAL LOW (ref 150–400)
RDW: 19.1 % — ABNORMAL HIGH (ref 11.5–15.5)
WBC: 54.3 10*3/uL (ref 4.0–10.5)

## 2010-12-11 LAB — BASIC METABOLIC PANEL
CO2: 24 mEq/L (ref 19–32)
Calcium: 9.3 mg/dL (ref 8.4–10.5)
GFR calc Af Amer: >90 mL/min (ref >90)
GFR calc non Af Amer: 83 mL/min — ABNORMAL LOW (ref >90)
Sodium: 136 mEq/L (ref 135–145)

## 2010-12-11 LAB — PROTIME-INR
INR: 1.35 (ref 0.00–1.49)
Prothrombin Time: 16.9 seconds — ABNORMAL HIGH (ref 11.6–15.2)

## 2010-12-11 LAB — VANCOMYCIN, TROUGH: Vancomycin Tr: 12 ug/mL (ref 10.0–20.0)

## 2010-12-12 ENCOUNTER — Encounter (HOSPITAL_COMMUNITY): Payer: Medicare Other

## 2010-12-12 DIAGNOSIS — C9110 Chronic lymphocytic leukemia of B-cell type not having achieved remission: Secondary | ICD-10-CM

## 2010-12-12 LAB — DIFFERENTIAL
Band Neutrophils: 0 % (ref 0–10)
Basophils Absolute: 0 10*3/uL (ref 0.0–0.1)
Basophils Relative: 0 % (ref 0–1)
Eosinophils Absolute: 0 10*3/uL (ref 0.0–0.7)
Eosinophils Relative: 0 % (ref 0–5)
Metamyelocytes Relative: 0 %
Monocytes Absolute: 0 10*3/uL — ABNORMAL LOW (ref 0.1–1.0)
Myelocytes: 0 %

## 2010-12-12 LAB — BASIC METABOLIC PANEL
Calcium: 9.7 mg/dL (ref 8.4–10.5)
Creatinine, Ser: 0.71 mg/dL (ref 0.50–1.10)
GFR calc non Af Amer: 85 mL/min — ABNORMAL LOW (ref >90)
Glucose, Bld: 124 mg/dL — ABNORMAL HIGH (ref 70–99)
Sodium: 140 mEq/L (ref 135–145)

## 2010-12-12 LAB — CBC
MCHC: 29.9 g/dL — ABNORMAL LOW (ref 30.0–36.0)
Platelets: 127 10*3/uL — ABNORMAL LOW (ref 150–400)
RDW: 19 % — ABNORMAL HIGH (ref 11.5–15.5)
WBC: 45.4 10*3/uL — ABNORMAL HIGH (ref 4.0–10.5)

## 2010-12-12 LAB — PROTIME-INR: INR: 1.34 (ref 0.00–1.49)

## 2010-12-12 LAB — PATHOLOGIST SMEAR REVIEW

## 2010-12-13 DIAGNOSIS — C9110 Chronic lymphocytic leukemia of B-cell type not having achieved remission: Secondary | ICD-10-CM

## 2010-12-13 DIAGNOSIS — R509 Fever, unspecified: Secondary | ICD-10-CM

## 2010-12-13 LAB — GLUCOSE, CAPILLARY
Glucose-Capillary: 137 mg/dL — ABNORMAL HIGH (ref 70–99)
Glucose-Capillary: 125 mg/dL — ABNORMAL HIGH (ref 70–99)
Glucose-Capillary: 129 mg/dL — ABNORMAL HIGH (ref 70–99)
Glucose-Capillary: 116 mg/dL — ABNORMAL HIGH (ref 70–99)
Glucose-Capillary: 118 mg/dL — ABNORMAL HIGH (ref 70–99)

## 2010-12-13 LAB — DIFFERENTIAL
Basophils Absolute: 0 10*3/uL (ref 0.0–0.1)
Basophils Relative: 0 % (ref 0–1)
Lymphocytes Relative: 97 % — ABNORMAL HIGH (ref 12–46)
Lymphs Abs: 42.1 10*3/uL — ABNORMAL HIGH (ref 0.7–4.0)
Neutro Abs: 0.4 10*3/uL — ABNORMAL LOW (ref 1.7–7.7)
Neutrophils Relative %: 1 % — ABNORMAL LOW (ref 43–77)
Promyelocytes Absolute: 0 %
nRBC: 0 /100 WBC

## 2010-12-13 LAB — CROSSMATCH: ABO/RH(D): B POS

## 2010-12-13 LAB — PROTIME-INR: INR: 1.42 (ref 0.00–1.49)

## 2010-12-13 LAB — CBC
HCT: 27.6 % — ABNORMAL LOW (ref 36.0–46.0)
Hemoglobin: 8.9 g/dL — ABNORMAL LOW (ref 12.0–15.0)
MCV: 81.2 fL (ref 78.0–100.0)
RBC: 3.4 MIL/uL — ABNORMAL LOW (ref 3.87–5.11)
RDW: 18.4 % — ABNORMAL HIGH (ref 11.5–15.5)
WBC: 43.4 10*3/uL — ABNORMAL HIGH (ref 4.0–10.5)

## 2010-12-14 LAB — GLUCOSE, CAPILLARY: Glucose-Capillary: 119 mg/dL — ABNORMAL HIGH (ref 70–99)

## 2010-12-14 LAB — PROTIME-INR: Prothrombin Time: 19.2 seconds — ABNORMAL HIGH (ref 11.6–15.2)

## 2010-12-15 ENCOUNTER — Other Ambulatory Visit: Payer: Self-pay | Admitting: Oncology

## 2010-12-15 ENCOUNTER — Encounter (HOSPITAL_BASED_OUTPATIENT_CLINIC_OR_DEPARTMENT_OTHER): Payer: Medicare Other | Admitting: Oncology

## 2010-12-15 DIAGNOSIS — R791 Abnormal coagulation profile: Secondary | ICD-10-CM

## 2010-12-15 DIAGNOSIS — Z86718 Personal history of other venous thrombosis and embolism: Secondary | ICD-10-CM

## 2010-12-15 DIAGNOSIS — C9110 Chronic lymphocytic leukemia of B-cell type not having achieved remission: Secondary | ICD-10-CM

## 2010-12-15 DIAGNOSIS — I8000 Phlebitis and thrombophlebitis of superficial vessels of unspecified lower extremity: Secondary | ICD-10-CM

## 2010-12-15 DIAGNOSIS — Z7901 Long term (current) use of anticoagulants: Secondary | ICD-10-CM

## 2010-12-15 DIAGNOSIS — R509 Fever, unspecified: Secondary | ICD-10-CM

## 2010-12-15 LAB — CULTURE, BLOOD (ROUTINE X 2)
Culture  Setup Time: 201210190218
Culture: NO GROWTH

## 2010-12-16 ENCOUNTER — Other Ambulatory Visit: Payer: Self-pay | Admitting: Oncology

## 2010-12-16 ENCOUNTER — Encounter (HOSPITAL_COMMUNITY): Payer: Medicare Other

## 2010-12-16 ENCOUNTER — Encounter (HOSPITAL_BASED_OUTPATIENT_CLINIC_OR_DEPARTMENT_OTHER): Payer: Medicare Other | Admitting: Oncology

## 2010-12-16 DIAGNOSIS — Z86718 Personal history of other venous thrombosis and embolism: Secondary | ICD-10-CM

## 2010-12-16 DIAGNOSIS — R509 Fever, unspecified: Secondary | ICD-10-CM

## 2010-12-16 DIAGNOSIS — Z7901 Long term (current) use of anticoagulants: Secondary | ICD-10-CM

## 2010-12-16 DIAGNOSIS — C9110 Chronic lymphocytic leukemia of B-cell type not having achieved remission: Secondary | ICD-10-CM

## 2010-12-16 LAB — PROTIME-INR: Protime: 21.6 Seconds — ABNORMAL HIGH (ref 10.6–13.4)

## 2010-12-20 NOTE — Discharge Summary (Signed)
[name], [name] NO.:  [id number]  MEDICAL RECORD NO.:  [id number]  LOCATION:  1343                         FACILITY:  [facility]  PHYSICIAN:  Levert Feinstein, M.D., F.A.C.P.DATE OF BIRTH: [birth year]  DATE OF ADMISSION:  12/08/2010 DATE OF DISCHARGE:  12/14/2010                              DISCHARGE SUMMARY   REASON FOR HOSPITALIZATION:  Febrile neutropenia.  HISTORY OF PRESENT ILLNESS:  [name] is a 70 year old woman with long- standing CLL.  Initial diagnosis dates to December 2001.  She has been heavily treated in the past, including fludarabine, Campath, Treanda, Arzerra, Rituxan, and high-dose steroids.  Most recently, she began treatment with CVP on December 02, 2010.  [name] presented to the office on December 08, 2010, with a 24-hour history of fever and shaking chills.  Her fever at home was greater than 101 degrees.  She was experiencing urinary frequency, urgency, and incontinence.  She also complained of nausea.  Labs done in the office showed a total white count of 98,000 with an absolute neutrophil count of 0.0.  She was subsequently admitted for further evaluation and treatment of febrile neutropenia.  HOSPITAL COURSE:  [name] was admitted to the oncology unit at [facility].  Admission labs showed hemoglobin 10.2, white count, 98,000, absolute neutrophil count 0.0, absolute lymphocyte count 98, platelet count 155,000.  Sodium 137, potassium 3.6, BUN 17, creatinine 0.73, glucose 100, bilirubin 0.5, alkaline phosphatase 154, SGOT 19, SGPT 25, total protein 5.6, albumin 3.6, calcium 10.0, PT 26.6, INR 2.41.  Two sets of blood cultures were obtained (1 central and 1 peripheral).  A urinalysis was also done and returned negative.  A urine culture was sent.  Chest x-ray on the day of admission showed no effusion, infiltrate or pneumothorax.  A rounded density on the lateral projection inferior to the carina was felt to likely  represent lymphadenopathy.  IV antibiotics were initiated with vancomycin and cefepime.  The fever curve improved within 48-72 hours of the hospitalization.  The blood cultures and the urine culture remained negative.  [name] was started on Neupogen on December 09, 2010, with the initial dose  480 mcg and  subsequent doses of 300 mcg subcu daily.  On December 12, 2010, the absolute neutrophil count had improved to 0.5.  Prior to the admission, [name] was maintained on Coumadin for a history of recurrent DVTs and PE.  The INR was therapeutic on admission at 2.41.  On December 09, 2010, the PT returned at 18.9 and the INR at 1.55.  Coumadin was continued and Lovenox was initiated with plans to continue until the INR was again therapeutic.  She received Coumadin 8 mg on December 09, 2010, 10 mg on October 20 and December 11, 2010, and 15 mg on October 22, October 23, and December 14, 2010.  The INR remained subtherapeutic with a value of 1.58 on December 14, 2010.  Lovenox was continued throughout the hospitalization.  She was given instructions to resume her usual home dose of 10 mg daily following discharge home and arrangements were made for her to receive a Lovenox injection at the cancer center on December 15, 2010, with plans for a repeat PT/INR on December 16, 2010.  On admission, [name] complained of urinary frequency, urgency, and incontinence.  Several months prior to the hospitalization, she had discontinued Enablex.  The Enablex was resumed during the hospitalization.  She continued to experience the urinary symptoms and was started on a trial of Detrol with improvement.  The Enablex was discontinued.  She was discharged home on Detrol.  On December 12, 2010, the hemoglobin returned at 7.9.  She was subsequently transfused 1 unit of packed red blood cells.  The hemoglobin on December 13, 2010, returned at 8.9.  [name] has a diagnosis of type 2 diabetes mellitus and is  maintained on metformin as an outpatient.  The metformin was placed on hold upon admission and sliding scale insulin was ordered for as needed use.  On December 14, 2010, [name] was felt to be stable for discharge home. The fever curve improved significantly during the hospitalization.  The low-grade temperatures and night sweats were felt to likely be due to the CLL.  DISCHARGE DIAGNOSES: 1. Culture negative fever/neutropenia status post 7-day course of IV     antibiotics. 2. 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     She is status post cycle 1 CVP December 02, 2010. 3. History of deep vein thrombosis/recurrent pulmonary embolism.     Discharged home on Coumadin with continuation of Lovenox. 4. Type 2 diabetes with metformin to be resumed at discharge. 5. Chronic urinary incontinence.  To be discharged home on Detrol.  CONSULTATIONS:  None.  PROCEDURES:  Transfusion 1 unit of packed red blood cells December 12, 2010.  DISPOSITION AND DISCHARGE: 1. Condition - stable for discharge home. 2. Activity - increase activity slowly. 3. Diet; no restrictions. 4. Wound care - routine Port-A-Cath care. 5. Follow up appointment with Dr. Cyndie Chime - [name] will be     contacted for an appointment.  She will come to the [facility]     on December 15, 2010, for a Lovenox injection and on December 16, 2010, for labs to include a PT/INR.  DISCHARGE MEDICATIONS: 1. Ativan 0.5 mg every 6 hours as needed. 2. Oxycodone 5 mg 1-2 tablets every 4 hours as needed. 3. Detrol 2 mg twice daily. 4. Ambien 5 mg at bedtime as needed. 5. Albuterol inhaler 2 puffs 4 times daily as needed. 6. Aspirin 81 mg daily. 7. Benadryl 25 mg every 6 hours as needed. 8. Coumadin 10 mg daily. 9. Metformin 500 mg twice daily. 10.Protonix 40 mg daily. 11.Tylenol 650 mg every 6 hours as needed. 12.Vitamin D2 50000  units on Monday, Wednesday, Friday. She was given instructions to discontinue Dalmane.     Arnaldo Natal, NP   ______________________________ Levert Feinstein, M.D., F.A.C.P.    LCT/MEDQ  D:  12/15/2010  T:  12/15/2010  Job:  [id number]  Electronically Signed by Lonna Cobb N.P. on 12/19/2010 03:36:43 PM Electronically Signed by Cephas Darby M.D. on 12/20/2010 08:55:06 AM

## 2010-12-21 LAB — CULTURE, BLOOD (SINGLE)

## 2010-12-22 NOTE — H&P (Signed)
[name], [name] NO.:  [id number]  MEDICAL RECORD NO.:  [id number]  LOCATION:  1343                         FACILITY:  [facility]  PHYSICIAN:  Ladene Artist, M.D.  DATE OF BIRTH:  [birth year]  DATE OF ADMISSION:  12/08/2010 DATE OF DISCHARGE:                             HISTORY & PHYSICAL   CHIEF COMPLAINT:  Fever and shaking chills.  HISTORY OF PRESENT ILLNESS:  [name] is a 70 year old woman with longstanding CLL.  Initial diagnosis dates to December 2001.  She has had partial remissions to multiple different treatments.  She has been treated in the past with fludarabine, Campath, Treanda, Arzerra, Rituxan, and high-dose steroids.  Most recently, she began treatment with CVP on December 02, 2010.  [name] presents to the office today with a 24-hour history of fever and shaking chills.  Her fever has been greater than 101 degrees.  She has been experiencing urinary frequency, urgency, and incontinence.  She denies hematuria and dysuria.  She has had intermittent nausea.  She has had no vomiting.  She is periodically short of breath.  She denies any cough.  Bowels have been moving regularly.  No constipation or diarrhea. She has had a recent sore throat and pain in the right ear.  She feels weak.  She denies any redness or pain associated with the port site at the right arm.  She has had no unusual headaches or vision change.  PAST MEDICAL HISTORY: 1. Longstanding CLL. 2. Recurrent DVTs and pulmonary embolus. 3. Advanced degenerative arthritis. 4. Morbid obesity. 5. Hypertension. 6. Steroid-induced diabetes. 7. History of influenza A pneumonitis.  MEDICATIONS: 1. Tylenol 650 mg as needed. 2. Albuterol inhaler 2 puffs 4 times a day as needed. 3. Aspirin 81 mg daily. 4. Vitamin D 40981 units Monday, Wednesday, Friday. 5. Benadryl 25 mg as needed. 6. Dalmane 30 mg at bedtime as needed. 7. Vicodin every 6 hours as needed. 8. Ativan 0.5 mg every 6  hours as needed. 9. Metformin 500 mg twice daily. 10.Protonix 40 mg daily. 11.Percocet 10/325 as needed. 12.Coumadin 10 mg daily.  ALLERGIES:  No known drug allergies.  FAMILY HISTORY:  Noncontributory.  SOCIAL HISTORY:  [name] lives in [place].  She is a widow.  She has 2 sons and 2 daughters.  No alcohol or tobacco use.  REVIEW OF SYSTEMS:  Per HPI.  PHYSICAL EXAMINATION:  VITAL SIGNS:  Temperature 101.5, heart rate 113, respirations 22, blood pressure 119/78. GENERAL:  Female in no acute distress. HEENT:  Normocephalic, atraumatic.  Pupils are equal, round, and reactive to light.  Extraocular movements are intact.  Sclerae are anicteric.  Oropharynx is without thrush or ulceration.  The right tympanic membrane is unremarkable.  There is a small amount of cerumen in the ear canal. LYMPH:  Bilateral tender lymph nodes in the cervical, supraclavicular, and axillary regions. CHEST:  Rales at the right lung base.  The respiratory rate is mildly increased. CARDIOVASCULAR:  Regular, tachycardiac. ABDOMEN:  Soft and nontender. EXTREMITIES:  Trace lower leg edema bilaterally.  The left leg appears slightly larger than the right.  Right upper extremity port site is nontender and without erythema.  NEURO:  Alert and oriented.  Moves all extremities.  LABS:  Hemoglobin 10.2, white count 98, absolute neutrophil count 0.0, absolute lymphocyte count 98, platelet count 155,000.  IMPRESSION: 1. Chronic lymphocytic leukemia, longstanding, heavily treated, status     post cycle 1 of CVP, December 02, 2010. 2. Febrile neutropenia/immunocompromised host. 3. Urinary frequency, urgency, and incontinence. 4. Recurrent deep venous thrombosis/pulmonary embolism, maintained on     Coumadin. 5. Diabetes mellitus, on metformin. 6. Hypertension. 7. Obesity. 8. Osteoarthritis.  PLAN: 1. Admit for cultures, chest x-ray, urinalysis, and begin antibiotics     with vancomycin and cefepime. 2.  Hold Coumadin pending the PT/INR results. 3. Hold oral diabetes medications, use sliding scale insulin. The patient interviewed and examined by Dr. Truett Perna; plan per Dr. Truett Perna.     Arnaldo Natal, NP   ______________________________ Ladene Artist, M.D.    LCT/MEDQ  D:  12/09/2010  T:  12/09/2010  Job:  [id number]  Electronically Signed by Lonna Cobb N.P. on 12/15/2010 62:13:08 PM Electronically Signed by Thornton Papas M.D. on 12/22/2010 09:34:44 AM

## 2010-12-23 ENCOUNTER — Encounter (HOSPITAL_BASED_OUTPATIENT_CLINIC_OR_DEPARTMENT_OTHER): Payer: Medicare Other | Admitting: Oncology

## 2010-12-23 ENCOUNTER — Other Ambulatory Visit: Payer: Self-pay | Admitting: Oncology

## 2010-12-23 ENCOUNTER — Other Ambulatory Visit: Payer: Self-pay | Admitting: Hematology and Oncology

## 2010-12-23 DIAGNOSIS — Z7901 Long term (current) use of anticoagulants: Secondary | ICD-10-CM

## 2010-12-23 DIAGNOSIS — Z86718 Personal history of other venous thrombosis and embolism: Secondary | ICD-10-CM

## 2010-12-23 DIAGNOSIS — C9110 Chronic lymphocytic leukemia of B-cell type not having achieved remission: Secondary | ICD-10-CM

## 2010-12-23 DIAGNOSIS — R509 Fever, unspecified: Secondary | ICD-10-CM

## 2010-12-23 DIAGNOSIS — I82409 Acute embolism and thrombosis of unspecified deep veins of unspecified lower extremity: Secondary | ICD-10-CM

## 2010-12-23 LAB — MANUAL DIFFERENTIAL
ALC: 54.3 10*3/uL — ABNORMAL HIGH (ref 0.9–3.3)
ANC (CHCC manual diff): 0.6 10*3/uL — ABNORMAL LOW (ref 1.5–6.5)
Blasts: 0 % (ref 0–0)
Metamyelocytes: 0 % (ref 0–0)
Myelocytes: 0 % (ref 0–0)
Other Cell: 0 % (ref 0–0)
PLT EST: DECREASED
SEG: 1 % — ABNORMAL LOW (ref 38–77)
Variant Lymph: 0 % (ref 0–0)

## 2010-12-23 LAB — CBC WITH DIFFERENTIAL/PLATELET
HCT: 26.2 % — ABNORMAL LOW (ref 34.8–46.6)
MCHC: 31.3 g/dL — ABNORMAL LOW (ref 31.5–36.0)
Platelets: 106 10*3/uL — ABNORMAL LOW (ref 145–400)
RBC: 3.26 10*6/uL — ABNORMAL LOW (ref 3.70–5.45)
WBC: 56 10*3/uL (ref 3.9–10.3)

## 2010-12-23 LAB — PROTIME-INR: INR: 2.4 (ref 2.00–3.50)

## 2010-12-26 NOTE — Miscellaneous (Deleted)
CC:   William McKeown, M.D. Mark Lanasa, MD  HISTORY OF PRESENT ILLNESS:  [name] is a 69-year-old woman with long- standing chronic CLL initially diagnosed in December 2001.  She has been treated with multiple agents in the past.  Most recently, she began treatment on the CVP regimen on 12/02/2010.  Her labs on 12/02/2010 showed a hemoglobin of 7.6, white count 71, absolute neutrophil count 2.1, and platelet count 170,000.  She was admitted to [facility] on 12/08/2010 with febrile neutropenia.  The cultures remained negative.  She completed a 7-day course of IV antibiotics.  She was discharged home on 12/14/2010.  [name] is seen today for scheduled followup prior to cycle 2 of CVP. She has had no further fevers.  She is having significant night sweats. Overall, she feels weak.  Her appetite is poor.  She estimates that she has lost 5 pounds over the past week.  She intermittently feels nauseated.  She denies vomiting.  Her urine symptoms have improved significantly since beginning Detrol.  She had multiple loose stools early in the week.  She has had no loose stools over the past several days.  PHYSICAL EXAMINATION:  Vital Signs:  Temperature 97.7.  Heart rate 115. Respirations 22.  Blood pressure 110/68.  Weight 239.1 pound (246.9 pounds on 12/08/2010).  HEENT:  Oropharynx is without thrush or ulceration.  Nodes:  Bilateral cervical, supraclavicular, and axillary lymph nodes.  Lungs:  Clear.  No wheezes or rales.  Heart:  Regular cardiac rhythm.  The heart rate is mildly increased.  Abdomen:  Soft and nontender.  Obese.  No obvious splenomegaly.  Extremities:  Trace lower leg edema bilaterally.  Calves are nontender.  Neurologic:  Motor strength is 5/5.  The right upper extremity port site is without erythema.  LABORATORY DATA:  Hemoglobin 8.2, white count 56, ANC 0.6 (97% lymphocytes), and platelet count 106,000.  IMPRESSION AND PLAN: 1. Long-standing chronic  lymphocytic leukemia, heavily treated, status     post cycle 1 of CVP on 12/02/2010. 2. Hospitalization on 12/08/2010 through 12/14/2010 with culture     negative fever/neutropenia, status post a 7 day course of     intravenous antibiotics. 3. Low-grade fever and night sweats, likely secondary to chronic     lymphocytic leukemia. 4. History of deep vein thrombosis/recurrent pulmonary embolism.  The     INR is therapeutic:  She will be meeting with the Coumadin     pharmacist later today. 5. Type 2 diabetes:  She continues metformin. 6. Urinary symptoms:  Significantly improved since beginning Detrol. 7. Disposition:  [name] is scheduled for cycle 2 of CVP today.  Dr.     Sherrill does not feel that her counts have recovered adequately to     proceed with treatment.  We will hold today's treatment and     rescheduled for 1 week.  She will return for a followup visit with     Dr. Granfortuna on 01/02/2011.  She will contact the office in the     interim with any problems. The plan was discussed with Dr. Sherrill in Dr. Granfortuna's absence.    ______________________________ Milledge Gerding C Horace Wishon, NP LCT/MEDQ  D:  12/23/2010  T:  12/26/2010  Job:  [id number] 

## 2010-12-27 ENCOUNTER — Other Ambulatory Visit: Payer: Self-pay | Admitting: *Deleted

## 2010-12-27 DIAGNOSIS — I809 Phlebitis and thrombophlebitis of unspecified site: Secondary | ICD-10-CM | POA: Insufficient documentation

## 2010-12-27 DIAGNOSIS — C9110 Chronic lymphocytic leukemia of B-cell type not having achieved remission: Secondary | ICD-10-CM

## 2010-12-27 DIAGNOSIS — I2699 Other pulmonary embolism without acute cor pulmonale: Secondary | ICD-10-CM | POA: Insufficient documentation

## 2010-12-27 MED ORDER — PREDNISONE 20 MG PO TABS: ORAL_TABLET | ORAL | Status: DC

## 2010-12-27 MED ORDER — CLARITHROMYCIN 500 MG PO TABS: 500.0000 mg | ORAL_TABLET | Freq: Two times a day (BID) | ORAL | Status: AC

## 2010-12-27 NOTE — Telephone Encounter (Signed)
Pt. Called with temp of 101.6 & stating she feels really bad,weak, & tired.  She also reports coughing & mucous drainage. Called pt. with instructions to stop avelox & start biaxin 500mg  bid x 10d per Dr. Cyndie Chime & to start prednisone 60mg  daily.  She questioned prednisone dose b/c she was supposed to take 6tabs daily x 5 days with chemo.  Discussed with Dr Frederich Chick & confirmed with pt to start the daily 60mg  dose & forget the other script for now & we will see how she does with that.  Scripts called to [facility], [address]  [phone].

## 2010-12-28 NOTE — Progress Notes (Unsigned)
CC:   Lucky Cowboy, M.D. Latanya Presser, MD  HISTORY OF PRESENT ILLNESS:  Ms. Ng is a 70 year old woman with long- standing chronic CLL initially diagnosed in December 2001.  She has been treated with multiple agents in the past.  Most recently, she began treatment on the CVP regimen on 12/02/2010.  Her labs on 12/02/2010 showed a hemoglobin of 7.6, white count 71, absolute neutrophil count 2.1, and platelet count 170,000.  She was admitted to [facility] on 12/08/2010 with febrile neutropenia.  The cultures remained negative.  She completed a 7-day course of IV antibiotics.  She was discharged home on 12/14/2010.  Ms. Enck is seen today for scheduled followup prior to cycle 2 of CVP. She has had no further fevers.  She is having significant night sweats. Overall, she feels weak.  Her appetite is poor.  She estimates that she has lost 5 pounds over the past week.  She intermittently feels nauseated.  She denies vomiting.  Her urine symptoms have improved significantly since beginning Detrol.  She had multiple loose stools early in the week.  She has had no loose stools over the past several days.  PHYSICAL EXAMINATION:  Vital Signs:  Temperature 97.7.  Heart rate 115. Respirations 22.  Blood pressure 110/68.  Weight 239.1 pound (246.9 pounds on 12/08/2010).  HEENT:  Oropharynx is without thrush or ulceration.  Nodes:  Bilateral cervical, supraclavicular, and axillary lymph nodes.  Lungs:  Clear.  No wheezes or rales.  Heart:  Regular cardiac rhythm.  The heart rate is mildly increased.  Abdomen:  Soft and nontender.  Obese.  No obvious splenomegaly.  Extremities:  Trace lower leg edema bilaterally.  Calves are nontender.  Neurologic:  Motor strength is 5/5.  The right upper extremity port site is without erythema.  LABORATORY DATA:  Hemoglobin 8.2, white count 56, ANC 0.6 (97% lymphocytes), and platelet count 106,000.  IMPRESSION AND PLAN: 1. Long-standing chronic  lymphocytic leukemia, heavily treated, status     post cycle 1 of CVP on 12/02/2010. 2. Hospitalization on 12/08/2010 through 12/14/2010 with culture     negative fever/neutropenia, status post a 7 day course of     intravenous antibiotics. 3. Low-grade fever and night sweats, likely secondary to chronic     lymphocytic leukemia. 4. History of deep vein thrombosis/recurrent pulmonary embolism.  The     INR is therapeutic:  She will be meeting with the Coumadin     pharmacist later today. 5. Type 2 diabetes:  She continues metformin. 6. Urinary symptoms:  Significantly improved since beginning Detrol. 7. Disposition:  Ms. Sasaki is scheduled for cycle 2 of CVP today.  Dr.     Truett Perna does not feel that her counts have recovered adequately to     proceed with treatment.  We will hold today's treatment and     rescheduled for 1 week.  She will return for a followup visit with     Dr. Cyndie Chime on 01/02/2011.  She will contact the office in the     interim with any problems. The plan was discussed with Dr. Truett Perna in Dr. Patsy Lager absence.    ______________________________ Arnaldo Natal, NP LCT/MEDQ  D:  12/23/2010  T:  12/26/2010  Job:  [id number]

## 2010-12-29 ENCOUNTER — Other Ambulatory Visit: Payer: Self-pay | Admitting: Oncology

## 2010-12-29 DIAGNOSIS — C9112 Chronic lymphocytic leukemia of B-cell type in relapse: Secondary | ICD-10-CM

## 2010-12-30 ENCOUNTER — Ambulatory Visit (HOSPITAL_COMMUNITY)
Admission: RE | Admit: 2010-12-30 | Discharge: 2010-12-30 | Disposition: A | Discharge status: Home / Self Care | Payer: Medicare Other | Source: Ambulatory Visit | Attending: Oncology | Admitting: Oncology

## 2010-12-30 ENCOUNTER — Other Ambulatory Visit: Payer: Self-pay | Admitting: Nurse Practitioner

## 2010-12-30 ENCOUNTER — Ambulatory Visit (HOSPITAL_BASED_OUTPATIENT_CLINIC_OR_DEPARTMENT_OTHER): Payer: Medicare Other

## 2010-12-30 ENCOUNTER — Telehealth: Payer: Self-pay | Admitting: Oncology

## 2010-12-30 ENCOUNTER — Ambulatory Visit: Payer: Medicare Other

## 2010-12-30 VITALS — BP 108/66 | HR 98 | Temp 97.7°F | Resp 18

## 2010-12-30 DIAGNOSIS — D649 Anemia, unspecified: Secondary | ICD-10-CM | POA: Insufficient documentation

## 2010-12-30 DIAGNOSIS — I2699 Other pulmonary embolism without acute cor pulmonale: Secondary | ICD-10-CM

## 2010-12-30 DIAGNOSIS — I82409 Acute embolism and thrombosis of unspecified deep veins of unspecified lower extremity: Secondary | ICD-10-CM

## 2010-12-30 DIAGNOSIS — C9110 Chronic lymphocytic leukemia of B-cell type not having achieved remission: Secondary | ICD-10-CM | POA: Insufficient documentation

## 2010-12-30 DIAGNOSIS — I809 Phlebitis and thrombophlebitis of unspecified site: Secondary | ICD-10-CM

## 2010-12-30 DIAGNOSIS — Z5111 Encounter for antineoplastic chemotherapy: Secondary | ICD-10-CM

## 2010-12-30 DIAGNOSIS — C9112 Chronic lymphocytic leukemia of B-cell type in relapse: Secondary | ICD-10-CM

## 2010-12-30 LAB — CBC WITH DIFFERENTIAL/PLATELET
Basophils Absolute: 0 10*3/uL (ref 0.0–0.1)
Eosinophils Absolute: 0.3 10*3/uL (ref 0.0–0.5)
HCT: 23.4 % — ABNORMAL LOW (ref 34.8–46.6)
HGB: 7.4 g/dL — ABNORMAL LOW (ref 11.6–15.9)
LYMPH%: 87.5 % — ABNORMAL HIGH (ref 14.0–49.7)
MCV: 82.8 fL (ref 79.5–101.0)
MONO%: 0.6 % (ref 0.0–14.0)
NEUT#: 7.5 10*3/uL — ABNORMAL HIGH (ref 1.5–6.5)
NEUT%: 11.4 % — ABNORMAL LOW (ref 38.4–76.8)
Platelets: 130 10*3/uL — ABNORMAL LOW (ref 145–400)
RBC: 2.83 10*6/uL — ABNORMAL LOW (ref 3.70–5.45)

## 2010-12-30 LAB — PROTIME-INR: Protime: 32.4 Seconds — ABNORMAL HIGH (ref 10.6–13.4)

## 2010-12-30 LAB — POCT INR: INR: 2.7

## 2010-12-30 LAB — TECHNOLOGIST REVIEW

## 2010-12-30 MED ORDER — SODIUM CHLORIDE 0.9 % IV SOLN: 800.0000 mg/m2 | Freq: Once | INTRAVENOUS | Status: DC

## 2010-12-30 MED ORDER — SODIUM CHLORIDE 0.9 % IV SOLN
800.0000 mg/m2 | Freq: Once | INTRAVENOUS | Status: AC
  Administered 2010-12-30: 1680 mg via INTRAVENOUS
  Filled 2010-12-30: qty 84

## 2010-12-30 MED ORDER — ONDANSETRON 16 MG/50ML IVPB (CHCC)
16.0000 mg | Freq: Once | INTRAVENOUS | Status: AC
  Administered 2010-12-30: 16 mg via INTRAVENOUS

## 2010-12-30 MED ORDER — VINCRISTINE SULFATE CHEMO INJECTION 1 MG/ML
2.0000 mg | Freq: Once | INTRAVENOUS | Status: AC
  Administered 2010-12-30: 2 mg via INTRAVENOUS
  Filled 2010-12-30: qty 2

## 2010-12-30 MED ORDER — SODIUM CHLORIDE 0.9 % IV SOLN
Freq: Once | INTRAVENOUS | Status: AC
  Administered 2010-12-30: 14:00:00 via INTRAVENOUS

## 2010-12-30 MED ORDER — SODIUM CHLORIDE 0.9 % IJ SOLN
10.0000 mL | INTRAMUSCULAR | Status: DC | PRN
  Filled 2010-12-30: qty 10

## 2010-12-30 MED ORDER — HEPARIN SOD (PORK) LOCK FLUSH 100 UNIT/ML IV SOLN
500.0000 [IU] | Freq: Once | INTRAVENOUS | Status: AC | PRN
  Administered 2010-12-30: 500 [IU]
  Filled 2010-12-30: qty 5

## 2010-12-30 MED ORDER — DEXAMETHASONE SODIUM PHOSPHATE 4 MG/ML IJ SOLN
20.0000 mg | Freq: Once | INTRAMUSCULAR | Status: AC
  Administered 2010-12-30: 20 mg via INTRAVENOUS

## 2010-12-30 MED ORDER — SODIUM CHLORIDE 0.9 % IV SOLN
Freq: Once | INTRAVENOUS | Status: AC
  Administered 2010-12-30: 11:00:00 via INTRAVENOUS

## 2010-12-30 NOTE — Telephone Encounter (Signed)
Called pt ,left message, pt has appt for Monday , added more appt for end of month. Advvised pt to obtain a calendar

## 2010-12-31 LAB — COMPREHENSIVE METABOLIC PANEL
Alkaline Phosphatase: 119 U/L — ABNORMAL HIGH (ref 39–117)
BUN: 11 mg/dL (ref 6–23)
Creatinine, Ser: 0.78 mg/dL (ref 0.50–1.10)
Glucose, Bld: 172 mg/dL — ABNORMAL HIGH (ref 70–99)
Total Bilirubin: 0.9 mg/dL (ref 0.3–1.2)

## 2010-12-31 LAB — LACTATE DEHYDROGENASE: LDH: 870 U/L — ABNORMAL HIGH (ref 94–250)

## 2011-01-02 ENCOUNTER — Telehealth: Payer: Self-pay | Admitting: Oncology

## 2011-01-02 ENCOUNTER — Other Ambulatory Visit: Payer: Self-pay | Admitting: Oncology

## 2011-01-02 ENCOUNTER — Ambulatory Visit (HOSPITAL_BASED_OUTPATIENT_CLINIC_OR_DEPARTMENT_OTHER): Payer: Medicare Other | Admitting: Oncology

## 2011-01-02 ENCOUNTER — Other Ambulatory Visit (HOSPITAL_BASED_OUTPATIENT_CLINIC_OR_DEPARTMENT_OTHER): Payer: Medicare Other | Admitting: Lab

## 2011-01-02 VITALS — BP 111/67 | HR 99 | Temp 97.6°F | Ht 63.0 in | Wt 237.3 lb

## 2011-01-02 DIAGNOSIS — Z7901 Long term (current) use of anticoagulants: Secondary | ICD-10-CM

## 2011-01-02 DIAGNOSIS — Z86718 Personal history of other venous thrombosis and embolism: Secondary | ICD-10-CM

## 2011-01-02 DIAGNOSIS — Z86711 Personal history of pulmonary embolism: Secondary | ICD-10-CM

## 2011-01-02 DIAGNOSIS — E119 Type 2 diabetes mellitus without complications: Secondary | ICD-10-CM

## 2011-01-02 DIAGNOSIS — C9110 Chronic lymphocytic leukemia of B-cell type not having achieved remission: Secondary | ICD-10-CM

## 2011-01-02 DIAGNOSIS — I82409 Acute embolism and thrombosis of unspecified deep veins of unspecified lower extremity: Secondary | ICD-10-CM

## 2011-01-02 DIAGNOSIS — R509 Fever, unspecified: Secondary | ICD-10-CM

## 2011-01-02 DIAGNOSIS — I2699 Other pulmonary embolism without acute cor pulmonale: Secondary | ICD-10-CM

## 2011-01-02 LAB — MANUAL DIFFERENTIAL
ALC: 49.5 10*3/uL — ABNORMAL HIGH (ref 0.9–3.3)
ANC (CHCC manual diff): 2.6 10*3/uL (ref 1.5–6.5)
Band Neutrophils: 0 % (ref 0–10)
Basophil: 0 % (ref 0–2)
Blasts: 0 % (ref 0–0)
LYMPH: 94 % — ABNORMAL HIGH (ref 14–49)
MONO: 0 % (ref 0–14)
Variant Lymph: 0 % (ref 0–0)
nRBC: 0 % (ref 0–0)

## 2011-01-02 LAB — COMPREHENSIVE METABOLIC PANEL
Albumin: 3.8 g/dL (ref 3.5–5.2)
BUN: 16 mg/dL (ref 6–23)
Calcium: 10 mg/dL (ref 8.4–10.5)
Chloride: 108 mEq/L (ref 96–112)
Creatinine, Ser: 0.7 mg/dL (ref 0.50–1.10)
Glucose, Bld: 149 mg/dL — ABNORMAL HIGH (ref 70–99)
Potassium: 3.6 mEq/L (ref 3.5–5.3)

## 2011-01-02 LAB — POCT INR: INR: 4.21

## 2011-01-02 LAB — PROTIME-INR

## 2011-01-02 LAB — TYPE AND SCREEN: Unit division: 0

## 2011-01-02 LAB — CBC WITH DIFFERENTIAL/PLATELET
HCT: 27.8 % — ABNORMAL LOW (ref 34.8–46.6)
HGB: 8.9 g/dL — ABNORMAL LOW (ref 11.6–15.9)
MCH: 27.7 pg (ref 25.1–34.0)
MCHC: 32.2 g/dL (ref 31.5–36.0)
Platelets: 114 10*3/uL — ABNORMAL LOW (ref 145–400)

## 2011-01-02 LAB — LACTATE DEHYDROGENASE: LDH: 444 U/L — ABNORMAL HIGH (ref 94–250)

## 2011-01-02 NOTE — Progress Notes (Addendum)
CC: Latanya Presser, MD   Ms. 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  She has been treated with multiple chemotherapy agents in the past.  She was most recently treated with high-dose steroids and Rituxan 04/18/2010 through 06/23/2010.  She is being followed by Dr. Leonard Downing at [facility] and is currently on a wait list for a clinical trial.  She is seen today for scheduled followup.   Ms. Vanderveer reports an isolated fever several weeks ago to 101.6 degrees.  She took Tylenol. She has had  no further fever.  She is having frequent night sweats.  Neck lymph nodes are "tender."  She thinks that they may be slightly increased in size.  She has intermittent dyspnea.  She utilizes a metered-dose inhaler as needed.  Her oral intake is erratic.  Some days she has a good appetite and other days she does not feel like eating.  She continues to have bilateral knee and leg pain.  Overall bowels have been moving regularly.  Since her last visit, she had a few days of loose stools.  PHYSICAL EXAMINATION:  Vital Signs:  Temperature 97.1, heart rate 113, respirations 22, blood pressure 144/80, weight 244.1 pounds (254.6 pounds 09/16/2010).  Oropharynx is without thrush or ulceration.  She has bilateral neck nodes.  The majority of the neck nodes are small, less than 1 cm.  At the right low neck there are some larger nodes measuring up to approximately 2 cm in size.  She has bilateral axillary nodes.  Lungs:  Clear.  No wheezes or rales.  Regular cardiac rhythm.  No murmur.  Abdomen:  Soft and obese.  No obvious organomegaly.  Trace lower leg/ankle edema bilaterally.  Port-A-Cath located at the right upper arm is without erythema.  LABORATORY DATA:  Hemoglobin 9.2, white count 28.8 with 93% lymphocytes and 4% neutrophils, platelet count 162,000.  Chemistry panel pending.  PT 19.2, INR 1.6.  IMPRESSION/PLAN:   1. Multiply-relapsed CLL. She is on a wait list for enrollment on a clinical  trial at [facility].  She has a followup visit with Dr. Leonard Downing next week. 2. 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  She continues chronic Coumadin anticoagulation followed through our office. 3. Type 2 diabetes.  She continues metformin. 4. Essential hypertension. 5. Morbid obesity. 6. Urinary stress incontinence. 7. Osteoarthritis. 8. Disposition.  Ms. Cass is scheduled to follow up with Dr. Leonard Downing at [facility] next week.  She expects that she will have more information regarding the clinical trial following that visit.  She will contact our office with an update.  She has a followup visit with Dr. Cyndie Chime on 12/13/2010.  She will contact the office in the interim with any problems.  We specifically discussed recurrent fever.    Plan reviewed with Dr. Cyndie Chime.   ______________________________ Arnaldo Natal, NP LCT/MEDQ  D:  10/31/2010  T:  11/01/2010  Job:  [id number]   Mrs. Mccloud was just discharged from the hospital yesterday.  She was admitted last Thursday with sudden onset of fever and shaking chills.  She is 1 week out from a new salvage chemotherapy regimen with Cytoxan, vincristine and prednisone.  All of her blood cultures and urine cultures came back negative.  Chest radiograph showed no pneumonia.  Initial high temp came down promptly on antibiotics.  No obvious source of the infection.  She had low-grade fevers for the next few days and was afebrile at discharge yesterday.     Her  INRs have been running low.  She got 2 extra 15 mg doses of Coumadin in the hospital.  Her usual daily dose is 10 mg.  Her INR was still subtherapeutic yesterday at 1.6.  She was advised to come to our office today for a Lovenox injection.  PHYSICAL EXAMINATION:  On arrival vital signs were taken and she was found to have a temperature of 101.5 degrees.  Pulse 113.  Blood pressure 119/78.  Respirations 22.  She did not appear toxic.  Pharynx:  No erythema or exudate.  Lungs:  Clear.  No tenderness at  the Port-A-Cath site medial upper right arm.  Abdomen:  Soft, nontender.  Extremities:  No edema.  No calf tenderness.  IMPRESSION:  At this point I think her fevers are due to her progressive and uncontrolled leukemia and not to an underlying infection.   I am going to put her back on prophylactic Avelox 400 mg daily.  She is due back in the office again tomorrow for a pro time check and, if necessary, additional Lovenox injections.  If she has any trouble prior to that she will call.    ______________________________ Levert Feinstein, M.D., F.A.C.P. JMG/MEDQ  D:  12/15/2010  T:  12/16/2010  Job:  [id number]  CSN [id number]  01/02/11  Followup visit for this 70 year old woman with long-standing chronic lymphocytic leukemia. Remission durations have become shorter and shorter.  at this point she has been treated with every active drug that we have for this disease.  She had a nice but transient response to a salvage regimen recommend  by  [facility]  at [facility] given between 2/27 and 5/12 with regression of all areas of lymphadenopathy, improvement in her performance status, and improvement in her blood counts. Unfortunately this was short lived. As a temporizing maneuver while waiting for a place on a clinical trial at [facility], she received a cycle of IV Cytoxan and vincristine with oral prednisone beginning on 10/12.  She tolerated this well. To date there does not appear to be much in the way of a response. In fact her constitutional symptoms have progressed and lymph nodes have regrown. She was hospitalized with fever unknown source on 10/18. Cultures were negative. Chest radiograph was normal. She received 6 days of parenteral broad-spectrum antibiotics in the hospital but on the day after discharge she spiked again to over 101. I was convinced at that point that it was the progression of her leukemia causing the fever. I did switch her antibiotics to Avelox and subsequently last week when  temperatures went up  again,  to Biaxin. She is now having  profuse night sweats and is starting to lose weight.  There was little impact on her CBC. White count was 71,000 hemoglobin 7.6 platelets 170,000 at the initiation of the CVP on 10/12. She received a red blood cell transfusion. White count peaked back on steroids to 98,000 x 10/ 18. White count has now plateaued and is 56,000 today with hemoglobin 8.2 and platelets 106,000. 99% lymphocytes on the white count differential.  She is weak. She is having difficulty ambulating. She is getting GI upset from the steroids. When she developed fevers again last week I told her to go back on steroids at 60 mg daily. This was successful in suppressing the fevers.  On exam blood pressure 111/67 pulse 99 regular temperature 97.6  weight 237 pounds.  There has been regrowth of lymph nodes in all areas in the neck supraclavicular and high axillary  regions. Pharynx no erythema or exudate. Lungs are clear. Regular cardiac rhythm no murmur. Abdomen soft obese mildly tender in the epigastric region. Extremities no edema. Neurologic motor strength 5 over 5 reflexes are symmetric at the knees. A Port-A-Cath proximal medial right arm nontender. Next  Additional pertinent lab random glucose 172 serum LDH progressive rise now age 59 compare with 17 on 9/10 and 498 on 7/27  Impression #1. Chronic lymphocytic leukemia. Now refractory to all reasonable treatments.  I did go ahead and give her a second cycle of CVP which was initially scheduled for 11/2 but delayed until 11/9. I told her today that if we don't start to see some improvement by the time she is due for her third cycle that I am going to abandon this regimen.  We had another discussion about the refractory nature of her leukemia. She understands that there  may not be a chemotherapy option to offer her in the near future. I reassured her we will continue to treat symptoms and try to keep her comfortable as long as we can.  I am  going to taper her steroids she is currently on 60 mg daily for the next 2 days then decrease to 40 mg x3 days then 20 mg daily. We will see her again in 2 weeks and decide whether or not to proceed with a third cycle of CVP which would be due on 11/30.  #2. History of recurrent DVT and pulmonary emboli on chronic Coumadin anticoagulation. Recurrent pulmonary embolus on Coumadin was held briefly. INR today is above her therapeutic range and final confirmation is pending. She is advised to hold her Coumadin until we give her a call with the final result.  #3. Type 2 diabetes on oral agent.  #4. Intermittent urinary incontinence partially relieved with Detrol.

## 2011-01-02 NOTE — Telephone Encounter (Signed)
Pt is seeing Dr Cyndie Chime today.  We will resolve this at that time.  Thanks dph

## 2011-01-02 NOTE — Telephone Encounter (Signed)
gve the pt her nov,dec 2012 appt calendar °

## 2011-01-06 ENCOUNTER — Ambulatory Visit: Payer: Self-pay | Admitting: Oncology

## 2011-01-06 DIAGNOSIS — I809 Phlebitis and thrombophlebitis of unspecified site: Secondary | ICD-10-CM

## 2011-01-06 DIAGNOSIS — I2699 Other pulmonary embolism without acute cor pulmonale: Secondary | ICD-10-CM

## 2011-01-13 ENCOUNTER — Ambulatory Visit (HOSPITAL_BASED_OUTPATIENT_CLINIC_OR_DEPARTMENT_OTHER): Payer: Medicare Other | Admitting: Nurse Practitioner

## 2011-01-13 ENCOUNTER — Other Ambulatory Visit (HOSPITAL_BASED_OUTPATIENT_CLINIC_OR_DEPARTMENT_OTHER): Payer: Medicare Other | Admitting: Lab

## 2011-01-13 ENCOUNTER — Ambulatory Visit: Payer: Medicare Other

## 2011-01-13 ENCOUNTER — Ambulatory Visit: Payer: Medicare Other | Admitting: Nurse Practitioner

## 2011-01-13 ENCOUNTER — Other Ambulatory Visit: Payer: Self-pay | Admitting: Oncology

## 2011-01-13 DIAGNOSIS — Z86718 Personal history of other venous thrombosis and embolism: Secondary | ICD-10-CM

## 2011-01-13 DIAGNOSIS — C9110 Chronic lymphocytic leukemia of B-cell type not having achieved remission: Secondary | ICD-10-CM

## 2011-01-13 DIAGNOSIS — R509 Fever, unspecified: Secondary | ICD-10-CM

## 2011-01-13 DIAGNOSIS — I2699 Other pulmonary embolism without acute cor pulmonale: Secondary | ICD-10-CM

## 2011-01-13 DIAGNOSIS — I809 Phlebitis and thrombophlebitis of unspecified site: Secondary | ICD-10-CM

## 2011-01-13 LAB — CBC WITH DIFFERENTIAL/PLATELET
BASO%: 0 % (ref 0.0–2.0)
Basophils Absolute: 0 10*3/uL (ref 0.0–0.1)
EOS%: 0.1 % (ref 0.0–7.0)
HCT: 28.9 % — ABNORMAL LOW (ref 34.8–46.6)
HGB: 9 g/dL — ABNORMAL LOW (ref 11.6–15.9)
MCH: 27.3 pg (ref 25.1–34.0)
MONO#: 0.5 10*3/uL (ref 0.1–0.9)
NEUT#: 25.6 10*3/uL — ABNORMAL HIGH (ref 1.5–6.5)
NEUT%: 30.8 % — ABNORMAL LOW (ref 38.4–76.8)
RDW: 19.6 % — ABNORMAL HIGH (ref 11.2–14.5)
WBC: 82.9 10*3/uL (ref 3.9–10.3)
lymph#: 56.8 10*3/uL — ABNORMAL HIGH (ref 0.9–3.3)

## 2011-01-13 LAB — POCT INR: INR: 2.1

## 2011-01-13 LAB — PROTIME-INR: INR: 2.1 (ref 2.00–3.50)

## 2011-01-13 LAB — COMPREHENSIVE METABOLIC PANEL
ALT: 20 U/L (ref 0–35)
Albumin: 3.8 g/dL (ref 3.5–5.2)
Alkaline Phosphatase: 138 U/L — ABNORMAL HIGH (ref 39–117)
Glucose, Bld: 184 mg/dL — ABNORMAL HIGH (ref 70–99)
Potassium: 4.1 mEq/L (ref 3.5–5.3)
Sodium: 140 mEq/L (ref 135–145)
Total Protein: 5.5 g/dL — ABNORMAL LOW (ref 6.0–8.3)

## 2011-01-13 LAB — LACTATE DEHYDROGENASE: LDH: 292 U/L — ABNORMAL HIGH (ref 94–250)

## 2011-01-13 NOTE — Progress Notes (Deleted)
Coumadin education complete.  Discussion included indication for anticoagulation, s/s of bleeding or clot and when to seek medical attention, drug interactions including OTC and herbals, compliance with Coumadin, vitamin K containing food interaction with coumadin and alcohol use.  Pt understood our discussion.   

## 2011-01-13 NOTE — Progress Notes (Signed)
OFFICE PROGRESS NOTE  Interval history:  [name] is a 70 year old woman with long-standing CLL. She began treatment with CVP 12/02/2010. She was hospitalized with fevers on 12/08/2010. Cultures remained negative. Chest x-ray was normal. She completed 6 days of IV antibiotics. She was discharged home 12/14/2010. The following day she was seen in the office by Dr. Cyndie Chime with recurrent fever. The fevers were felt to be secondary to the leukemia and not an underlying infection. She completed cycle 2 of CVP on 12/30/2010.  She is seen today for scheduled followup. Overall she is feeling well. She denies fevers. She continues to have night sweats. She thinks lymph nodes are stable to smaller. She denies nausea or vomiting. No diarrhea or constipation. She developed a single mouth sore recently. She is using utilizing a mouth rinse. She denies pain at present. She notes that the lymph nodes at the base of her neck become "tender "following chemotherapy. She has a good appetite. Urinary symptoms continue to be improved. She continues Detrol.   Objective: Blood pressure 138/79, pulse 108, temperature 98.6 F (37 C), temperature source Oral, height 5\' 3"  (1.6 m), weight 232 lb 3.2 oz (105.325 kg).  Oropharynx is without thrush or ulceration. Mucous membranes are pink and moist. Approximate 1 cm bilateral cervical and supraclavicular lymph nodes. 2 cm bilateral axillary nodes. Lungs are clear. No wheezes or rales. Regular cardiac rhythm. Abdomen is soft and nontender. No organomegaly. Extremities are without edema. Motor strength is 5 over 5. Right upper extremity port site is nontender and without erythema.   Lab Results:  Hemoglobin 9.0 white count 82.9 absolute neutrophil count 25.6 platelet count 162,000 PT 25.2 INR 2.1   Studies/Results: No results found.  Medications: I have reviewed the patient's current medications.  Assessment/Plan:  1. Long-standing chronic lymphocytic leukemia,  heavily treated, status post cycle 2 of CVP on 12/30/2010. 2. Hospitalization 12/08/2010 through 12/14/2010 with culture negative fever/neutropenia, status post a 7 day course of intravenous antibiotics. 3. Low-grade fever and night sweats, likely secondary to chronic lymphocytic leukemia. The fevers have resolved since beginning prednisone. 4. History of deep vein thrombosis/recurrent pulmonary embolism.  The INR is therapeutic. She had an appointment with the Coumadin pharmacist earlier today. 5. Type 2 diabetes:  She continues metformin. 6. Urinary symptoms:  Significantly improved since beginning Detrol.  Disposition-[name] appears stable. Peripheral adenopathy is not significantly changed. CBC shows improvement/stabilization of the hemoglobin and improvement in the percent neutrophils with a concomitant decrease in the percent lymphocytes. Plan to proceed with cycle 3 CVP as scheduled 01/20/2011. She will return for a followup visit with Dr. Cyndie Chime on 02/01/2011. She will contact the office in the interim with any problems.  Plan reviewed with Dr. Cyndie Chime.   Lonna Cobb ANP/GNP-BC

## 2011-01-17 ENCOUNTER — Telehealth: Payer: Self-pay | Admitting: Oncology

## 2011-01-17 NOTE — Telephone Encounter (Signed)
Put son's fmla papers on nurse's desk

## 2011-01-18 ENCOUNTER — Telehealth: Payer: Self-pay | Admitting: Oncology

## 2011-01-18 NOTE — Telephone Encounter (Signed)
Faxed son's fmla papers to [facility] @ [phone], put originals in registration desk

## 2011-01-19 ENCOUNTER — Other Ambulatory Visit: Payer: Self-pay | Admitting: Certified Registered Nurse Anesthetist

## 2011-01-20 ENCOUNTER — Other Ambulatory Visit: Payer: Self-pay

## 2011-01-20 ENCOUNTER — Other Ambulatory Visit: Payer: Self-pay | Admitting: Oncology

## 2011-01-20 ENCOUNTER — Ambulatory Visit: Payer: Medicare Other

## 2011-01-20 ENCOUNTER — Ambulatory Visit (HOSPITAL_BASED_OUTPATIENT_CLINIC_OR_DEPARTMENT_OTHER): Payer: Self-pay | Admitting: Pharmacist

## 2011-01-20 ENCOUNTER — Other Ambulatory Visit (HOSPITAL_BASED_OUTPATIENT_CLINIC_OR_DEPARTMENT_OTHER): Payer: Medicare Other | Admitting: Lab

## 2011-01-20 ENCOUNTER — Ambulatory Visit (HOSPITAL_BASED_OUTPATIENT_CLINIC_OR_DEPARTMENT_OTHER): Payer: Medicare Other

## 2011-01-20 VITALS — BP 146/75 | Temp 98.0°F | Ht 63.0 in | Wt 232.0 lb

## 2011-01-20 DIAGNOSIS — Z5111 Encounter for antineoplastic chemotherapy: Secondary | ICD-10-CM

## 2011-01-20 DIAGNOSIS — C9110 Chronic lymphocytic leukemia of B-cell type not having achieved remission: Secondary | ICD-10-CM

## 2011-01-20 DIAGNOSIS — I2699 Other pulmonary embolism without acute cor pulmonale: Secondary | ICD-10-CM

## 2011-01-20 DIAGNOSIS — Z7901 Long term (current) use of anticoagulants: Secondary | ICD-10-CM

## 2011-01-20 DIAGNOSIS — I809 Phlebitis and thrombophlebitis of unspecified site: Secondary | ICD-10-CM

## 2011-01-20 LAB — MANUAL DIFFERENTIAL
ALC: 87 10*3/uL — ABNORMAL HIGH (ref 0.9–3.3)
ANC (CHCC manual diff): 1.8 10*3/uL (ref 1.5–6.5)
Band Neutrophils: 0 % (ref 0–10)
Blasts: 0 % (ref 0–0)
Other Cell: 0 % (ref 0–0)
PLT EST: ADEQUATE
PROMYELO: 0 % (ref 0–0)
SEG: 2 % — ABNORMAL LOW (ref 38–77)
Variant Lymph: 0 % (ref 0–0)
nRBC: 0 % (ref 0–0)

## 2011-01-20 LAB — CBC WITH DIFFERENTIAL/PLATELET
HCT: 27.8 % — ABNORMAL LOW (ref 34.8–46.6)
HGB: 8.8 g/dL — ABNORMAL LOW (ref 11.6–15.9)
MCH: 27.6 pg (ref 25.1–34.0)
MCHC: 31.5 g/dL (ref 31.5–36.0)
MCV: 87.6 fL (ref 79.5–101.0)
Platelets: 150 10*3/uL (ref 145–400)

## 2011-01-20 LAB — PROTIME-INR: INR: 2.3 (ref 2.00–3.50)

## 2011-01-20 MED ORDER — ONDANSETRON 16 MG/50ML IVPB (CHCC)
16.0000 mg | Freq: Once | INTRAVENOUS | Status: AC
  Administered 2011-01-20: 16 mg via INTRAVENOUS

## 2011-01-20 MED ORDER — SODIUM CHLORIDE 0.9 % IV SOLN
Freq: Once | INTRAVENOUS | Status: AC
  Administered 2011-01-20: 11:00:00 via INTRAVENOUS

## 2011-01-20 MED ORDER — SODIUM CHLORIDE 0.9 % IJ SOLN
10.0000 mL | INTRAMUSCULAR | Status: DC | PRN
  Administered 2011-01-20: 10 mL
  Filled 2011-01-20: qty 10

## 2011-01-20 MED ORDER — SODIUM CHLORIDE 0.9 % IV SOLN: 800.0000 mg/m2 | Freq: Once | INTRAVENOUS | Status: DC

## 2011-01-20 MED ORDER — SODIUM CHLORIDE 0.9 % IV SOLN
800.0000 mg/m2 | Freq: Once | INTRAVENOUS | Status: AC
  Administered 2011-01-20: 1680 mg via INTRAVENOUS
  Filled 2011-01-20: qty 84

## 2011-01-20 MED ORDER — DEXAMETHASONE SODIUM PHOSPHATE 4 MG/ML IJ SOLN
20.0000 mg | Freq: Once | INTRAMUSCULAR | Status: AC
Start: 2011-01-20 — End: 2011-01-20
  Administered 2011-01-20: 20 mg via INTRAVENOUS

## 2011-01-20 MED ORDER — HEPARIN SOD (PORK) LOCK FLUSH 100 UNIT/ML IV SOLN
500.0000 [IU] | Freq: Once | INTRAVENOUS | Status: AC | PRN
  Administered 2011-01-20: 500 [IU]
  Filled 2011-01-20: qty 5

## 2011-01-20 MED ORDER — VINCRISTINE SULFATE CHEMO INJECTION 1 MG/ML
2.0000 mg | Freq: Once | INTRAVENOUS | Status: AC
  Administered 2011-01-20: 2 mg via INTRAVENOUS
  Filled 2011-01-20: qty 2

## 2011-01-20 NOTE — Progress Notes (Signed)
Pt on last of her Prednisone taper (down to 1 tab/day).  Completed Biaxin. We can see her 12/12 w/ MD visit. GTucker, Pharm.D.

## 2011-01-20 NOTE — Patient Instructions (Signed)
1405-Pt discharged ambulatory with next appointment confirmed.  Pt aware to call with any questions or concerns.

## 2011-01-27 ENCOUNTER — Other Ambulatory Visit: Payer: Self-pay | Admitting: Pharmacist

## 2011-01-27 DIAGNOSIS — I82409 Acute embolism and thrombosis of unspecified deep veins of unspecified lower extremity: Secondary | ICD-10-CM

## 2011-01-27 DIAGNOSIS — I2699 Other pulmonary embolism without acute cor pulmonale: Secondary | ICD-10-CM

## 2011-01-27 DIAGNOSIS — I809 Phlebitis and thrombophlebitis of unspecified site: Secondary | ICD-10-CM

## 2011-02-01 ENCOUNTER — Other Ambulatory Visit: Payer: Medicare Other | Admitting: Lab

## 2011-02-01 ENCOUNTER — Ambulatory Visit: Payer: Medicare Other

## 2011-02-01 ENCOUNTER — Telehealth: Payer: Self-pay | Admitting: Oncology

## 2011-02-01 ENCOUNTER — Ambulatory Visit (HOSPITAL_BASED_OUTPATIENT_CLINIC_OR_DEPARTMENT_OTHER): Payer: Medicare Other | Admitting: Oncology

## 2011-02-01 ENCOUNTER — Other Ambulatory Visit (HOSPITAL_BASED_OUTPATIENT_CLINIC_OR_DEPARTMENT_OTHER): Payer: Medicare Other | Admitting: Lab

## 2011-02-01 ENCOUNTER — Other Ambulatory Visit: Payer: Self-pay | Admitting: Oncology

## 2011-02-01 VITALS — BP 128/76 | HR 118 | Temp 97.2°F | Ht 63.0 in | Wt 232.4 lb

## 2011-02-01 DIAGNOSIS — E559 Vitamin D deficiency, unspecified: Secondary | ICD-10-CM

## 2011-02-01 DIAGNOSIS — R61 Generalized hyperhidrosis: Secondary | ICD-10-CM

## 2011-02-01 DIAGNOSIS — Z86718 Personal history of other venous thrombosis and embolism: Secondary | ICD-10-CM

## 2011-02-01 DIAGNOSIS — C9110 Chronic lymphocytic leukemia of B-cell type not having achieved remission: Secondary | ICD-10-CM

## 2011-02-01 DIAGNOSIS — Z7901 Long term (current) use of anticoagulants: Secondary | ICD-10-CM

## 2011-02-01 DIAGNOSIS — I2699 Other pulmonary embolism without acute cor pulmonale: Secondary | ICD-10-CM

## 2011-02-01 DIAGNOSIS — Z5181 Encounter for therapeutic drug level monitoring: Secondary | ICD-10-CM

## 2011-02-01 LAB — CBC WITH DIFFERENTIAL/PLATELET
HCT: 27 % — ABNORMAL LOW (ref 34.8–46.6)
MCH: 27 pg (ref 25.1–34.0)
MCHC: 30.8 g/dL — ABNORMAL LOW (ref 31.5–36.0)
Platelets: 152 10*3/uL (ref 145–400)

## 2011-02-01 LAB — MANUAL DIFFERENTIAL
ALC: 70.6 10*3/uL — ABNORMAL HIGH (ref 0.9–3.3)
LYMPH: 99 % — ABNORMAL HIGH (ref 14–49)
MONO: 0 % (ref 0–14)
Metamyelocytes: 0 % (ref 0–0)
Variant Lymph: 0 % (ref 0–0)

## 2011-02-01 LAB — PROTIME-INR

## 2011-02-01 LAB — POCT INR: INR: 1.8

## 2011-02-01 NOTE — Progress Notes (Signed)
Faxed registration papers to [employer] after reviewed with patient and had her sign. Reviewed side effects of med and procedure for her monthly surveys. Provided her with phone # of [employer] to do 1st survey tomorrow.

## 2011-02-01 NOTE — Progress Notes (Signed)
Followup visit for this 70 year old woman with long-standing chronic lymphocytic leukemia. She has been now treated with every known active agent for this disease. Over the last year disease has become more aggressive with progressive constitutional symptoms, fever, weight loss, and lymphadenopathy. She had a transient response to megadose steroids plus Rituxan. Most recently she has been on a trial of IV Cytoxan 1 g per meter squared every 3 weeks with vincristine & prednisone. She is still on steroids at current dose down to 20 mg daily. Cytoxan was started in late October. She has had a total of 3 doses most recently given on 11/30. Once again she has had a transient response with reduction in lymphadenopathy and a modest decrease in her total white blood count which peaked at 98,000 on 10/11 cut down to 53,000 x 11/12 and is now rising again with a count of 91,000 on 11/30 and 71,000 today. She had a transient rise in her neutrophils up to 31% on 11/23 for today's differential again shows 99% lymphocytes and only 1% neutrophils. Steroids initially effective in suppressing her fevers. I have tapered down to 20 mg daily and fevers or now recurring up to 102. She continues to have profuse night sweats and is now having sweats during the day as well. Fortunately she has not had any serious infection yet. I put her on a short course of Biaxin and temporarily held her Septra since she is also on chronic Coumadin. I'm going to resume the Septra and Valtrex at this time. He does not look like we will be able to get her off steroids. We are getting some secondary gain from the steroids which have helped her arthritic knee pain. She is not taking any calcium but is taking vitamin D. I recommended that she start calcium today.  Physical exam: Weight down 40 pounds from November of last year. Stable at 232 pounds over the last month but after a 5 pound weight loss compared with 11/12. There is recurrent  lymphadenopathy in the neck bilaterally and the axillae bilaterally. Lungs are clear with some scattered dry rales at the right base Cardiac exam with a regular rhythm no murmur Abdomen soft and obese unable to palpate a spleen although it is most certainly enlarged. Chronic 1+ edema. No focal neurologic deficits.  Impression: Heavily treated chronic lymphocytic leukemia now in aggressive phase. I am just back from the [employer] of hematology meetings. There are some exciting new drugs with activity in CLL including the BTK inhibitors. Unfortunately there are still no open spots on local clinical trials and she is really unstable to travel outside this state.  Plan: In reviewing her treatment history, she did have a dramatic fall in her white count on a brief trial of Revlimid and given him back in may through October of 2008. I never advanced or above a 5 mg dose 421 days each month. Her response was so dramatic I actually had to hold the drug for a few weeks after the first cycle due to my concern for tumor lysis. She did get a typical flare with painful lymphadenopathy and wound up in the hospital following cycle 1.  I think the only hope for any meaningful response is to use a biological at this point. I'm going to go start her back on 5 mg of Revlimid daily. Continue the prednisone 20 mg daily. It is becoming increasingly difficult to keep the ball rolling.  #2 history of DVT and recurrent pulmonary emboli. She is  back on full dose Coumadin.  #3. Advanced degenerative arthritis.  #4. 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 Significant improvement after approximate 6 month trial of monthly IVIG. She also had a sinus drainage procedure.  #5. Morbid obesity now with a 40 pound weight loss over the last 12 months.

## 2011-02-01 NOTE — Telephone Encounter (Signed)
gve the pt her dec,jan 2013 appt calendar °

## 2011-02-02 ENCOUNTER — Ambulatory Visit (HOSPITAL_BASED_OUTPATIENT_CLINIC_OR_DEPARTMENT_OTHER): Payer: Self-pay | Admitting: Pharmacist

## 2011-02-02 ENCOUNTER — Other Ambulatory Visit: Payer: Self-pay

## 2011-02-02 DIAGNOSIS — C9110 Chronic lymphocytic leukemia of B-cell type not having achieved remission: Secondary | ICD-10-CM

## 2011-02-02 DIAGNOSIS — I809 Phlebitis and thrombophlebitis of unspecified site: Secondary | ICD-10-CM

## 2011-02-02 DIAGNOSIS — I2699 Other pulmonary embolism without acute cor pulmonale: Secondary | ICD-10-CM

## 2011-02-02 MED ORDER — LENALIDOMIDE 5 MG PO CAPS: 5.0000 mg | ORAL_CAPSULE | Freq: Every day | ORAL | Status: DC

## 2011-02-02 NOTE — Progress Notes (Signed)
Pt's initial Revlimid prescription faxed to [facility] at [phone], per Karel Jarvis, managed care.

## 2011-02-02 NOTE — Progress Notes (Signed)
Pt saw MD yesterday (02/01/11) & was told to keep Coumadin dose the same (8 mg/day). I spoke w/ pt over phone today & she will be restarting Septra DS MWF.  She plans to start this on 02/06/11.  She'll also start Valtrex the same day. She remains on Prednisone 20 mg/day. Back when pt was on Septra (4/12) she was also receiving Solumedrol 1000 mg IV w/ Rituxan.  Her Coumadin dose then was 4 mg/day. We will plan to see pt on 02/17/11 (12 days after starting Septra back) since she has other labs planned that same day. We may have to back off of her Coumadin dose at that visit.  Maybe add in some days of 6 mg if INR elevated from Septra. [name], Pharm.D.

## 2011-02-03 ENCOUNTER — Other Ambulatory Visit: Payer: Self-pay

## 2011-02-03 DIAGNOSIS — C9110 Chronic lymphocytic leukemia of B-cell type not having achieved remission: Secondary | ICD-10-CM

## 2011-02-03 MED ORDER — ALLOPURINOL 300 MG PO TABS: 300.0000 mg | ORAL_TABLET | Freq: Every day | ORAL | Status: DC

## 2011-02-03 NOTE — Telephone Encounter (Signed)
Pt notified by phone of Allopurinol e-prescribed to [facility] on [address]. dph

## 2011-02-17 ENCOUNTER — Ambulatory Visit (HOSPITAL_BASED_OUTPATIENT_CLINIC_OR_DEPARTMENT_OTHER): Payer: Medicare Other

## 2011-02-17 ENCOUNTER — Ambulatory Visit: Payer: Medicare Other

## 2011-02-17 ENCOUNTER — Other Ambulatory Visit: Payer: Self-pay | Admitting: Nurse Practitioner

## 2011-02-17 ENCOUNTER — Other Ambulatory Visit: Payer: Self-pay | Admitting: *Deleted

## 2011-02-17 ENCOUNTER — Other Ambulatory Visit: Payer: Medicare Other | Admitting: Lab

## 2011-02-17 ENCOUNTER — Other Ambulatory Visit: Payer: Self-pay | Admitting: Oncology

## 2011-02-17 ENCOUNTER — Ambulatory Visit (HOSPITAL_BASED_OUTPATIENT_CLINIC_OR_DEPARTMENT_OTHER): Payer: Self-pay | Admitting: Oncology

## 2011-02-17 ENCOUNTER — Encounter (HOSPITAL_COMMUNITY)
Admission: RE | Admit: 2011-02-17 | Discharge: 2011-02-17 | Disposition: A | Discharge status: Home / Self Care | Payer: Medicare Other | Source: Ambulatory Visit | Attending: Oncology | Admitting: Oncology

## 2011-02-17 ENCOUNTER — Telehealth: Payer: Self-pay | Admitting: Oncology

## 2011-02-17 VITALS — BP 110/80 | HR 77 | Temp 99.5°F | Resp 18

## 2011-02-17 DIAGNOSIS — C9110 Chronic lymphocytic leukemia of B-cell type not having achieved remission: Secondary | ICD-10-CM

## 2011-02-17 DIAGNOSIS — D649 Anemia, unspecified: Secondary | ICD-10-CM | POA: Insufficient documentation

## 2011-02-17 DIAGNOSIS — I809 Phlebitis and thrombophlebitis of unspecified site: Secondary | ICD-10-CM

## 2011-02-17 DIAGNOSIS — I2699 Other pulmonary embolism without acute cor pulmonale: Secondary | ICD-10-CM

## 2011-02-17 LAB — MANUAL DIFFERENTIAL
ANC (CHCC manual diff): 2.1 10*3/uL (ref 1.5–6.5)
Basophil: 0 % (ref 0–2)
Blasts: 0 % (ref 0–0)
Metamyelocytes: 0 % (ref 0–0)
Myelocytes: 0 % (ref 0–0)
PLT EST: DECREASED
PROMYELO: 0 % (ref 0–0)

## 2011-02-17 LAB — CBC WITH DIFFERENTIAL/PLATELET
MCHC: 30.3 g/dL — ABNORMAL LOW (ref 31.5–36.0)
RBC: 2.58 10*6/uL — ABNORMAL LOW (ref 3.70–5.45)

## 2011-02-17 LAB — POCT INR: INR: 1.9

## 2011-02-17 LAB — PROTIME-INR: INR: 1.9 — ABNORMAL LOW (ref 2.00–3.50)

## 2011-02-17 LAB — HOLD TUBE, BLOOD BANK

## 2011-02-17 MED ORDER — SODIUM CHLORIDE 0.9 % IV SOLN: Freq: Once | INTRAVENOUS | Status: DC

## 2011-02-17 MED ORDER — ACETAMINOPHEN 325 MG PO TABS
650.0000 mg | ORAL_TABLET | Freq: Once | ORAL | Status: AC
  Administered 2011-02-17: 650 mg via ORAL

## 2011-02-17 MED ORDER — LIDOCAINE-PRILOCAINE 2.5-2.5 % EX CREA: TOPICAL_CREAM | Freq: Once | CUTANEOUS | Status: DC

## 2011-02-17 NOTE — Telephone Encounter (Signed)
Rcvd a call from patient's son, Ramon Dredge [phone] concerning his fmla.  Question 6 needed to show continuous leave instead of intermittent; corrected form and put on nurse's desk for MD's signature.

## 2011-02-17 NOTE — Patient Instructions (Signed)
Pt discharged  Via ambulatory. Pt to call with concerns. Encouraged to check temp later tonight and tomorrow. Drink plenty of fluids.

## 2011-02-17 NOTE — Progress Notes (Signed)
Pt doing ok.  She has usual swelling in legs.  Pain in her back from "pulling it" this morning trying to lift something heavy.  She went to PCP this AM & was prescribed a muscle relaxer (?pt unsure of name of drug). She plans to eat collard greens over New Year's.  Therefore, I will have her take 10 mg the day after New Year's only. She plans to start Revlimid + Allopurinol after the first of the year. We will need to watch the INR for increase when the Allopurinol begins. I asked pt to call us when she decides to start the Allopurinol so that we can bring her in for protime check if needed before 03/03/11.  [name], Pharm.D.

## 2011-02-18 LAB — TYPE AND SCREEN
Antibody Screen: NEGATIVE
Unit division: 0

## 2011-02-20 NOTE — Progress Notes (Signed)
Disability forms for pt signed by Dr Cyndie Chime & returned to [facility] in managed care. dph

## 2011-02-22 ENCOUNTER — Ambulatory Visit: Payer: Self-pay | Admitting: Oncology

## 2011-02-22 DIAGNOSIS — I809 Phlebitis and thrombophlebitis of unspecified site: Secondary | ICD-10-CM

## 2011-02-22 DIAGNOSIS — I2699 Other pulmonary embolism without acute cor pulmonale: Secondary | ICD-10-CM

## 2011-02-22 NOTE — Progress Notes (Signed)
Pt called today to let us know she started on the Revlimid & Allopurinol yesterday. I will change her Coumadin clinic appt to 02/28/11 so we can detect if the INR will be affected. [name], Pharm.D.

## 2011-02-28 ENCOUNTER — Ambulatory Visit: Payer: Self-pay | Admitting: Oncology

## 2011-02-28 ENCOUNTER — Ambulatory Visit: Payer: Medicare Other

## 2011-02-28 ENCOUNTER — Other Ambulatory Visit (HOSPITAL_BASED_OUTPATIENT_CLINIC_OR_DEPARTMENT_OTHER): Payer: Medicare Other | Admitting: Lab

## 2011-02-28 DIAGNOSIS — I809 Phlebitis and thrombophlebitis of unspecified site: Secondary | ICD-10-CM

## 2011-02-28 DIAGNOSIS — I2699 Other pulmonary embolism without acute cor pulmonale: Secondary | ICD-10-CM

## 2011-02-28 DIAGNOSIS — C9110 Chronic lymphocytic leukemia of B-cell type not having achieved remission: Secondary | ICD-10-CM

## 2011-02-28 DIAGNOSIS — I82409 Acute embolism and thrombosis of unspecified deep veins of unspecified lower extremity: Secondary | ICD-10-CM

## 2011-02-28 LAB — CBC WITH DIFFERENTIAL/PLATELET
HGB: 9 g/dL — ABNORMAL LOW (ref 11.6–15.9)
MCH: 28.5 pg (ref 25.1–34.0)
RDW: 16.6 % — ABNORMAL HIGH (ref 11.2–14.5)
WBC: 96.2 10*3/uL (ref 3.9–10.3)

## 2011-02-28 LAB — MANUAL DIFFERENTIAL
Band Neutrophils: 0 % (ref 0–10)
Basophil: 0 % (ref 0–2)
EOS: 0 % (ref 0–7)
LYMPH: 98 % — ABNORMAL HIGH (ref 14–49)
nRBC: 0 % (ref 0–0)

## 2011-02-28 LAB — PROTIME-INR

## 2011-02-28 LAB — COMPREHENSIVE METABOLIC PANEL
BUN: 14 mg/dL (ref 6–23)
CO2: 21 mEq/L (ref 19–32)
Calcium: 10.2 mg/dL (ref 8.4–10.5)
Chloride: 101 mEq/L (ref 96–112)
Creatinine, Ser: 0.79 mg/dL (ref 0.50–1.10)
Sodium: 137 mEq/L (ref 135–145)

## 2011-02-28 LAB — LACTATE DEHYDROGENASE: LDH: 525 U/L — ABNORMAL HIGH (ref 94–250)

## 2011-02-28 NOTE — Progress Notes (Unsigned)
INR therapeutic - 2.2.  Pt doing well.  No complaints. Continue 8mg  daily.  Recheck INR in 2 weeks with next appt with Lonna Cobb.

## 2011-03-01 ENCOUNTER — Ambulatory Visit: Payer: Medicare Other

## 2011-03-01 ENCOUNTER — Encounter (INDEPENDENT_AMBULATORY_CARE_PROVIDER_SITE_OTHER): Payer: Medicare Other | Admitting: Ophthalmology

## 2011-03-01 DIAGNOSIS — H43819 Vitreous degeneration, unspecified eye: Secondary | ICD-10-CM

## 2011-03-01 DIAGNOSIS — E1139 Type 2 diabetes mellitus with other diabetic ophthalmic complication: Secondary | ICD-10-CM

## 2011-03-01 DIAGNOSIS — H4420 Degenerative myopia, unspecified eye: Secondary | ICD-10-CM

## 2011-03-01 DIAGNOSIS — E11319 Type 2 diabetes mellitus with unspecified diabetic retinopathy without macular edema: Secondary | ICD-10-CM

## 2011-03-01 DIAGNOSIS — H3530 Unspecified macular degeneration: Secondary | ICD-10-CM

## 2011-03-03 ENCOUNTER — Ambulatory Visit: Payer: Medicare Other

## 2011-03-03 ENCOUNTER — Other Ambulatory Visit: Payer: Medicare Other

## 2011-03-07 ENCOUNTER — Other Ambulatory Visit: Payer: Self-pay | Admitting: *Deleted

## 2011-03-07 ENCOUNTER — Encounter (INDEPENDENT_AMBULATORY_CARE_PROVIDER_SITE_OTHER): Payer: Medicare Other | Admitting: Ophthalmology

## 2011-03-07 DIAGNOSIS — C9110 Chronic lymphocytic leukemia of B-cell type not having achieved remission: Secondary | ICD-10-CM

## 2011-03-07 MED ORDER — LENALIDOMIDE 5 MG PO CAPS: 5.0000 mg | ORAL_CAPSULE | Freq: Every day | ORAL | Status: DC

## 2011-03-07 NOTE — Telephone Encounter (Signed)
Pt. notified that revlimid script was faxed to [facility] & reminders discussed with pt. for this drug.   She reports that she is not out & only started revlimid 02/21/11 after receiving drug in late Dec.

## 2011-03-10 ENCOUNTER — Ambulatory Visit (HOSPITAL_BASED_OUTPATIENT_CLINIC_OR_DEPARTMENT_OTHER): Payer: Medicare Other

## 2011-03-10 ENCOUNTER — Other Ambulatory Visit: Payer: Self-pay | Admitting: Nurse Practitioner

## 2011-03-10 ENCOUNTER — Telehealth: Payer: Self-pay | Admitting: *Deleted

## 2011-03-10 ENCOUNTER — Encounter (HOSPITAL_COMMUNITY)
Admission: RE | Admit: 2011-03-10 | Discharge: 2011-03-10 | Disposition: A | Discharge status: Home / Self Care | Payer: Medicare Other | Source: Ambulatory Visit | Attending: Oncology | Admitting: Oncology

## 2011-03-10 ENCOUNTER — Other Ambulatory Visit: Payer: Self-pay | Admitting: *Deleted

## 2011-03-10 VITALS — BP 111/78 | HR 108 | Temp 98.5°F | Resp 32

## 2011-03-10 DIAGNOSIS — C9110 Chronic lymphocytic leukemia of B-cell type not having achieved remission: Secondary | ICD-10-CM

## 2011-03-10 DIAGNOSIS — I2699 Other pulmonary embolism without acute cor pulmonale: Secondary | ICD-10-CM

## 2011-03-10 DIAGNOSIS — D649 Anemia, unspecified: Secondary | ICD-10-CM

## 2011-03-10 LAB — MANUAL DIFFERENTIAL
Basophil: 0 % (ref 0–2)
LYMPH: 98 % — ABNORMAL HIGH (ref 14–49)
MONO: 0 % (ref 0–14)
Metamyelocytes: 0 % (ref 0–0)
PLT EST: DECREASED
Variant Lymph: 0 % (ref 0–0)

## 2011-03-10 LAB — PREPARE RBC (CROSSMATCH)

## 2011-03-10 LAB — TYPE AND SCREEN

## 2011-03-10 LAB — CBC WITH DIFFERENTIAL/PLATELET
HCT: 23.4 % — ABNORMAL LOW (ref 34.8–46.6)
MCH: 28.6 pg (ref 25.1–34.0)
MCHC: 32 g/dL (ref 31.5–36.0)
Platelets: 88 10*3/uL — ABNORMAL LOW (ref 145–400)

## 2011-03-10 MED ORDER — HEPARIN SOD (PORK) LOCK FLUSH 100 UNIT/ML IV SOLN
500.0000 [IU] | Freq: Once | INTRAVENOUS | Status: AC
  Administered 2011-03-10: 500 [IU] via INTRAVENOUS
  Filled 2011-03-10: qty 5

## 2011-03-10 MED ORDER — ACETAMINOPHEN 325 MG PO TABS
650.0000 mg | ORAL_TABLET | Freq: Once | ORAL | Status: AC
  Administered 2011-03-10: 650 mg via ORAL

## 2011-03-10 MED ORDER — SODIUM CHLORIDE 0.9 % IJ SOLN
10.0000 mL | INTRAMUSCULAR | Status: DC | PRN
  Administered 2011-03-10: 10 mL via INTRAVENOUS
  Filled 2011-03-10: qty 10

## 2011-03-10 NOTE — Progress Notes (Signed)
1815- VSS. Pt has no s/s of reaction.  Rate continued at 185 ml/hr x remainder of infusion.

## 2011-03-10 NOTE — Telephone Encounter (Signed)
Received call this am from pt stating that she feels weak & dizzy & can hardly get her breath at times & sometimes feels burning in her chest when she is SOB,   She also reports temps ranging from 99-101 which may be up in the am & down at noon.  She has been feeling this way most of the week. She reports no other signs or symptoms of infection.  Discussed with Lonna Cobb NP & pt instructed to come in for cbc & possible crossmatch.  She reports that she has to get a ride but her son just came in & she thinks she can come in @ 11am for lab.

## 2011-03-14 ENCOUNTER — Ambulatory Visit (HOSPITAL_BASED_OUTPATIENT_CLINIC_OR_DEPARTMENT_OTHER): Payer: Medicare Other | Admitting: Nurse Practitioner

## 2011-03-14 ENCOUNTER — Telehealth: Payer: Self-pay | Admitting: Oncology

## 2011-03-14 ENCOUNTER — Ambulatory Visit: Payer: Medicare Other

## 2011-03-14 ENCOUNTER — Encounter (HOSPITAL_COMMUNITY): Payer: Medicare Other

## 2011-03-14 ENCOUNTER — Telehealth: Payer: Self-pay

## 2011-03-14 ENCOUNTER — Other Ambulatory Visit: Payer: Medicare Other | Admitting: Lab

## 2011-03-14 ENCOUNTER — Ambulatory Visit: Payer: Medicare Other | Admitting: Nurse Practitioner

## 2011-03-14 VITALS — BP 117/68 | HR 106 | Temp 98.7°F

## 2011-03-14 DIAGNOSIS — C9110 Chronic lymphocytic leukemia of B-cell type not having achieved remission: Secondary | ICD-10-CM

## 2011-03-14 DIAGNOSIS — I2699 Other pulmonary embolism without acute cor pulmonale: Secondary | ICD-10-CM

## 2011-03-14 DIAGNOSIS — I82409 Acute embolism and thrombosis of unspecified deep veins of unspecified lower extremity: Secondary | ICD-10-CM

## 2011-03-14 DIAGNOSIS — Z515 Encounter for palliative care: Secondary | ICD-10-CM | POA: Insufficient documentation

## 2011-03-14 DIAGNOSIS — I809 Phlebitis and thrombophlebitis of unspecified site: Secondary | ICD-10-CM

## 2011-03-14 LAB — PROTIME-INR
INR: 2.6 (ref 2.00–3.50)
Protime: 31.2 Seconds — ABNORMAL HIGH (ref 10.6–13.4)

## 2011-03-14 MED ORDER — CLOTRIMAZOLE 10 MG MT LOZG: 10.0000 mg | LOZENGE | Freq: Four times a day (QID) | OROMUCOSAL | Status: AC

## 2011-03-14 MED ORDER — PREDNISONE 20 MG PO TABS: 20.0000 mg | ORAL_TABLET | Freq: Every day | ORAL | Status: DC

## 2011-03-14 NOTE — Telephone Encounter (Signed)
New pt referral made to [facility] with [name]. dph

## 2011-03-14 NOTE — Progress Notes (Unsigned)
"  [name]" is feeling bad today.  She is "tired" & "ready for this to be over".  Her son confirms that his mother has felt really bad recently.  She is tearful today. INR therapeutic. Continue 8 mg/day of Coumadin.   Pt seeing Lonna Cobb today.  If med changes are made we will need to see Ms. Fullilove before 3 weeks but for now we will plan for her to return 04/04/11. [name], Pharm.D.

## 2011-03-14 NOTE — Telephone Encounter (Signed)
Gv pt appt for feb2013 °

## 2011-03-15 ENCOUNTER — Encounter: Payer: Self-pay | Admitting: *Deleted

## 2011-03-15 NOTE — Progress Notes (Signed)
Late Entry:  A note was given to pt's daughter, Denny Peon stating that she needs to take time off from work periodically to assist in care of her mother with leukemia dated 02/01/11.

## 2011-03-17 ENCOUNTER — Other Ambulatory Visit: Payer: Self-pay | Admitting: *Deleted

## 2011-03-17 DIAGNOSIS — C9110 Chronic lymphocytic leukemia of B-cell type not having achieved remission: Secondary | ICD-10-CM

## 2011-03-17 MED ORDER — ZOLPIDEM TARTRATE 5 MG PO TABS: 5.0000 mg | ORAL_TABLET | Freq: Every evening | ORAL | Status: AC | PRN

## 2011-03-19 NOTE — Progress Notes (Signed)
OFFICE PROGRESS NOTE  Interval history:  [name] is a 71 year old woman with long-standing CLL. She has been heavily treated. She was most recently started on a trial of Revlimid. She is seen today for scheduled followup.  [name] reports continued fevers and sweats. The fevers are in the 100-101 range. She feels weak. She has had no falls. Appetite is poor. She denies nausea/vomiting. She is having loose stools which she attributes to the Revlimid. She denies bleeding.   Objective: Blood pressure 117/68, pulse 106, temperature 98.7 F (37.1 C), temperature source Oral.  Oropharynx is without thrush or ulceration. Progressive adenopathy in the cervical, supraclavicular and axillary regions bilaterally. Lungs are clear. No wheezes or rales. Regular cardiac rhythm. Abdomen is soft and nontender. Obese. She was unable to maneuver onto the exam table for a full examination. Trace lower leg edema bilaterally. Right upper extremity port site is nontender and without erythema.  Lab Results: Lab Results  Component Value Date   WBC 92.9* 03/10/2011   HGB 7.5* 03/10/2011   HCT 23.4* 03/10/2011   MCV 89.3 03/10/2011   PLT 88* 03/10/2011    Chemistry:    Chemistry      Component Value Date/Time   NA 137 02/28/2011 1555   K 3.7 02/28/2011 1555   CL 101 02/28/2011 1555   CO2 21 02/28/2011 1555   BUN 14 02/28/2011 1555   CREATININE 0.79 02/28/2011 1555      Component Value Date/Time   CALCIUM 10.2 02/28/2011 1555   ALKPHOS 231* 02/28/2011 1555   AST 17 02/28/2011 1555   ALT 23 02/28/2011 1555   BILITOT 0.6 02/28/2011 1555       Studies/Results: No results found.  Medications: I have reviewed the patient's current medications.  Assessment/Plan:  1. Long-standing chronic lymphocytic leukemia, heavily treated, most recently started on a trial of Revlimid. 2. Hospitalization 12/08/2010 through 12/14/2010 with culture negative fever/neutropenia, status post a 7 day course of intravenous  antibiotics. 3. Fevers and night sweats, secondary to chronic lymphocytic leukemia. 4. History of deep vein thrombosis/recurrent pulmonary embolism. The INR is therapeutic. She had an appointment with the Coumadin pharmacist earlier today. 5. Type 2 diabetes. 6. History of recurrent sinopulmonary infections due to CLL with significant improvement after an approximate 6 month trial of monthly IVIG. She also had a sinus drainage procedure. 7. Advanced degenerative arthritis.  Disposition-Ms. Soland has progressive CLL. Her performance status is declining. Dr. Cyndie Chime recommends a hospice referral. Ms. Heffler is in agreement. We discussed CODE STATUS/end-of-life issues. She will be placed on no CODE BLUE status. We will contact the [facility] hospice program today. For palliation of current symptoms we will increase the prednisone dose to 60 mg daily for one week. She will then decrease the dose back to 20 mg daily. She will return for a followup visit in the next 2-3 weeks. She will contact the office in the interim with any problems.  Patient seen with Dr. Cyndie Chime.  Lonna Cobb ANP/GNP-BC

## 2011-03-20 ENCOUNTER — Encounter (HOSPITAL_COMMUNITY): Payer: Medicare Other

## 2011-03-21 ENCOUNTER — Telehealth: Payer: Self-pay | Admitting: *Deleted

## 2011-03-21 NOTE — Telephone Encounter (Signed)
Received vm call from Paula/Hospice RN stating she saw pt. Yest. & her BP was 114/74, R 40, & HR was 150  all at rest but pt was in no distress, just weak & Pt was not surpised with these numbers. Report to Dr. Cyndie Chime.

## 2011-03-23 ENCOUNTER — Encounter (INDEPENDENT_AMBULATORY_CARE_PROVIDER_SITE_OTHER): Payer: Medicare Other | Admitting: Ophthalmology

## 2011-03-23 DIAGNOSIS — E11319 Type 2 diabetes mellitus with unspecified diabetic retinopathy without macular edema: Secondary | ICD-10-CM

## 2011-03-23 DIAGNOSIS — E1165 Type 2 diabetes mellitus with hyperglycemia: Secondary | ICD-10-CM

## 2011-03-23 DIAGNOSIS — H4420 Degenerative myopia, unspecified eye: Secondary | ICD-10-CM

## 2011-03-24 ENCOUNTER — Encounter (HOSPITAL_COMMUNITY): Payer: Medicare Other

## 2011-03-24 ENCOUNTER — Emergency Department (HOSPITAL_COMMUNITY)

## 2011-03-24 ENCOUNTER — Emergency Department (HOSPITAL_COMMUNITY)
Admission: EM | Admit: 2011-03-24 | Discharge: 2011-03-24 | Disposition: A | Discharge status: Home / Self Care | Attending: Emergency Medicine | Admitting: Emergency Medicine

## 2011-03-24 ENCOUNTER — Telehealth: Payer: Self-pay | Admitting: *Deleted

## 2011-03-24 ENCOUNTER — Encounter (HOSPITAL_COMMUNITY): Payer: Self-pay | Admitting: Emergency Medicine

## 2011-03-24 DIAGNOSIS — N731 Chronic parametritis and pelvic cellulitis: Secondary | ICD-10-CM | POA: Insufficient documentation

## 2011-03-24 DIAGNOSIS — D649 Anemia, unspecified: Secondary | ICD-10-CM | POA: Insufficient documentation

## 2011-03-24 DIAGNOSIS — K612 Anorectal abscess: Secondary | ICD-10-CM | POA: Insufficient documentation

## 2011-03-24 DIAGNOSIS — R599 Enlarged lymph nodes, unspecified: Secondary | ICD-10-CM | POA: Insufficient documentation

## 2011-03-24 DIAGNOSIS — K610 Anal abscess: Secondary | ICD-10-CM

## 2011-03-24 DIAGNOSIS — L0231 Cutaneous abscess of buttock: Secondary | ICD-10-CM | POA: Insufficient documentation

## 2011-03-24 DIAGNOSIS — K429 Umbilical hernia without obstruction or gangrene: Secondary | ICD-10-CM | POA: Insufficient documentation

## 2011-03-24 DIAGNOSIS — Z7901 Long term (current) use of anticoagulants: Secondary | ICD-10-CM | POA: Insufficient documentation

## 2011-03-24 DIAGNOSIS — C9112 Chronic lymphocytic leukemia of B-cell type in relapse: Secondary | ICD-10-CM | POA: Insufficient documentation

## 2011-03-24 DIAGNOSIS — L03317 Cellulitis of buttock: Secondary | ICD-10-CM | POA: Insufficient documentation

## 2011-03-24 HISTORY — DX: Chronic leukemia of unspecified cell type not having achieved remission: C95.10

## 2011-03-24 LAB — BASIC METABOLIC PANEL
BUN: 14 mg/dL (ref 6–23)
GFR calc non Af Amer: 87 mL/min — ABNORMAL LOW (ref >90)
Glucose, Bld: 190 mg/dL — ABNORMAL HIGH (ref 70–99)
Potassium: 3.9 mEq/L (ref 3.5–5.1)

## 2011-03-24 LAB — CBC
HCT: 24.3 % — ABNORMAL LOW (ref 36.0–46.0)
Hemoglobin: 7.1 g/dL — ABNORMAL LOW (ref 12.0–15.0)
MCH: 26.7 pg (ref 26.0–34.0)
MCHC: 29.2 g/dL — ABNORMAL LOW (ref 30.0–36.0)
MCV: 91.4 fL (ref 78.0–100.0)
Platelets: 92 10*3/uL — ABNORMAL LOW (ref 150–400)
RBC: 2.66 MIL/uL — ABNORMAL LOW (ref 3.87–5.11)
RDW: 17.2 % — ABNORMAL HIGH (ref 11.5–15.5)
WBC: 94.9 10*3/uL (ref 4.0–10.5)

## 2011-03-24 LAB — PROTIME-INR
INR: 1.55 — ABNORMAL HIGH (ref 0.00–1.49)
Prothrombin Time: 18.9 seconds — ABNORMAL HIGH (ref 11.6–15.2)

## 2011-03-24 LAB — BASIC METABOLIC PANEL WITH GFR
CO2: 25 meq/L (ref 19–32)
Calcium: 10.2 mg/dL (ref 8.4–10.5)
Chloride: 102 meq/L (ref 96–112)
Creatinine, Ser: 0.65 mg/dL (ref 0.50–1.10)
GFR calc Af Amer: >90 mL/min (ref >90)
Sodium: 138 meq/L (ref 135–145)

## 2011-03-24 LAB — GRAM STAIN

## 2011-03-24 MED ORDER — LIDOCAINE-EPINEPHRINE 2 %-1:100000 IJ SOLN
20.0000 mL | Freq: Once | INTRAMUSCULAR | Status: DC
  Filled 2011-03-24: qty 1

## 2011-03-24 MED ORDER — SODIUM CHLORIDE 0.9 % IJ SOLN: 10.0000 mL | INTRAMUSCULAR | Status: DC | PRN

## 2011-03-24 MED ORDER — VANCOMYCIN HCL IN DEXTROSE 1-5 GM/200ML-% IV SOLN
1000.0000 mg | INTRAVENOUS | Status: AC
  Administered 2011-03-24: 1000 mg via INTRAVENOUS
  Filled 2011-03-24 (×2): qty 200

## 2011-03-24 MED ORDER — IOHEXOL 300 MG/ML  SOLN
100.0000 mL | Freq: Once | INTRAMUSCULAR | Status: AC | PRN
  Administered 2011-03-24: 100 mL via INTRAVENOUS

## 2011-03-24 MED ORDER — SODIUM CHLORIDE 0.9 % IJ SOLN
10.0000 mL | Freq: Two times a day (BID) | INTRAMUSCULAR | Status: DC
Start: 2011-03-24 — End: 2011-03-24

## 2011-03-24 MED ORDER — SULFAMETHOXAZOLE-TRIMETHOPRIM 800-160 MG PO TABS: 1.0000 | ORAL_TABLET | Freq: Two times a day (BID) | ORAL | Status: DC

## 2011-03-24 MED ORDER — ONDANSETRON HCL 4 MG/2ML IJ SOLN: 4.0000 mg | Freq: Once | INTRAMUSCULAR | Status: DC

## 2011-03-24 MED ORDER — CLINDAMYCIN HCL 150 MG PO CAPS: 300.0000 mg | ORAL_CAPSULE | Freq: Three times a day (TID) | ORAL | Status: AC

## 2011-03-24 NOTE — ED Provider Notes (Signed)
History    71 year old female with a pain in her buttocks. Gradual onset about to 4 days ago. Progressively worsening since then. Area tender to touch and also feels warm. No fevers or chills. Denies trauma. No pain with bowel movements. Has a history of chronic lymphocytic leukemia and is on hospice. A history of anemia requiring periodic transfusions. Is on Coumadin for history of PE.  CSN: [id number]  Arrival date & time 03/24/11  1235   First MD Initiated Contact with Patient 03/24/11 1318      Chief Complaint  Patient presents with  . Cellulitis    Leukemia pt  . Leukemia    (Consider location/radiation/quality/duration/timing/severity/associated sxs/prior treatment) HPI  Past Medical History  Diagnosis Date  . Leukemia chronic     No past surgical history on file.  No family history on file.  History  Substance Use Topics  . Smoking status: Not on file  . Smokeless tobacco: Never Used  . Alcohol Use: No    OB History    Grav Para Term Preterm Abortions TAB SAB Ect Mult Living                  Review of Systems   Review of symptoms negative unless otherwise noted in HPI.  Allergies  Review of patient's allergies indicates no known allergies.  Home Medications   Current Outpatient Rx  Name Route Sig Dispense Refill  . ASPIRIN EC 81 MG PO TBEC Oral Take 81 mg by mouth daily.    Marland Kitchen CITALOPRAM HYDROBROMIDE 40 MG PO TABS Oral Take 40 mg by mouth daily as needed. For "bad mood".    . CLOTRIMAZOLE 10 MG MT LOZG Oral Take 1 lozenge (10 mg total) by mouth 4 (four) times daily. 120 lozenge 3  . ERGOCALCIFEROL 50000 UNITS PO CAPS Oral Take 50,000 Units by mouth every Monday, Wednesday, and Friday.     Marland Kitchen LORAZEPAM 0.5 MG PO TABS Oral Take 0.5 mg by mouth Every 6 hours as needed. For anxiety.    . METFORMIN HCL 500 MG PO TABS Oral Take 500 mg by mouth 2 (two) times daily with a meal.      . OXYCODONE HCL 5 MG PO TABS Oral Take 5 mg by mouth every 4 (four) hours as  needed. For pain.    Marland Kitchen PANTOPRAZOLE SODIUM 40 MG PO TBEC Oral Take 40 mg by mouth Daily.    Marland Kitchen PREDNISONE 20 MG PO TABS Oral Take 20 mg by mouth daily.     . TOLTERODINE TARTRATE 2 MG PO TABS Oral Take 2 mg by mouth 2 (two) times daily.    Marland Kitchen VALACYCLOVIR HCL 1 G PO TABS Oral Take 500 mg by mouth Daily. Takes 1/2 tab daily    . WARFARIN SODIUM 4 MG PO TABS Oral Take 8 mg by mouth daily.    Marland Kitchen ZOLPIDEM TARTRATE 5 MG PO TABS Oral Take 1 tablet (5 mg total) by mouth at bedtime as needed for sleep. 30 tablet 0    BP 114/77  Pulse 114  Temp(Src) 98.6 F (37 C) (Oral)  Resp 20  SpO2 98%  Physical Exam  Nursing note and vitals reviewed. Constitutional: She appears well-developed and well-nourished. No distress.       Plan bed. No acute distress. Obese.  HENT:  Head: Normocephalic and atraumatic.  Eyes: Conjunctivae are normal. Right eye exhibits no discharge. Left eye exhibits no discharge.  Neck: Neck supple.  Cardiovascular: Normal rate, regular rhythm and normal  heart sounds.  Exam reveals no gallop and no friction rub.   No murmur heard. Pulmonary/Chest: Effort normal and breath sounds normal. No respiratory distress.  Abdominal: Soft. She exhibits no distension. There is no tenderness.  Genitourinary:       Near midline of left buttock with a tender, fluctuant lesion. Mild erythema. Bedside US was performed and does show fluid collection. This is several centimeters away from the anus. No rectal tenderness or fluctuance.  Musculoskeletal: She exhibits no edema and no tenderness.  Neurological: She is alert.  Skin: Skin is warm and dry.  Psychiatric: She has a normal mood and affect. Her behavior is normal. Thought content normal.    ED Course  Procedures (including critical care time)  Labs Reviewed  PROTIME-INR - Abnormal; Notable for the following:    Prothrombin Time 18.9 (*)    INR 1.55 (*)    All other components within normal limits  CBC - Abnormal; Notable for the  following:    WBC 94.9 (*)    RBC 2.66 (*)    Hemoglobin 7.1 (*)    HCT 24.3 (*)    MCHC 29.2 (*)    RDW 17.2 (*)    Platelets 92 (*) PLATELETS APPEAR ADEQUATE   All other components within normal limits  BASIC METABOLIC PANEL - Abnormal; Notable for the following:    Glucose, Bld 190 (*)    GFR calc non Af Amer 87 (*)    All other components within normal limits  PATHOLOGIST SMEAR REVIEW  CULTURE, ROUTINE-ABSCESS  GRAM STAIN  LAB REPORT - SCANNED   No results found.   1. Perianal abscess       MDM  71 year old female with a perianal abscess. Patient with a history of CLL and chronic anemia. Is also on Coumadin for history of pulmonary embolism. Given this will check labs prior to incision and drainage.         Raeford Razor, MD 04/05/11 0120

## 2011-03-24 NOTE — ED Notes (Signed)
Family at bedside. 

## 2011-03-24 NOTE — ED Notes (Signed)
Patient is resting comfortably. 

## 2011-03-24 NOTE — Progress Notes (Signed)
Room [facility] [address] ED [address] Arihanna B. Christell Constant  [facility]  [facility] RN Visit  Patient active with [facility] home care services with diagnosis of leukemia.    Pt alert & oriented, lying on ED stretcher, with complaints of discomfort with hard lump left buttock - febrile last pm and this am.   Dtr present in room.  Medication list with [facility] on shadow chart.  Please call [facility] @ [phone] with any needs.  Thank you.  Joneen Boers, RN  [facility]  Hospice Liaison

## 2011-03-24 NOTE — ED Notes (Signed)
Implanted Port Right Upper Arm flushed with 20 cc normal saline before extension was removed---band-aid applied.  Needle removed intact.

## 2011-03-24 NOTE — ED Provider Notes (Signed)
  Physical Exam  BP 135/72  Pulse 108  Temp(Src) 99.1 F (37.3 C) (Rectal)  Resp 16  SpO2 100%  Physical Exam  ED Course  Procedures  MDM  Patient received in signout. Patient has chronic lymphocytic leukemia that is not being well-controlled. She is currently under hospice care. She'll low-grade temperature today. She's had increasing swelling around her left buttock near her rectal region. Patient is currently on Coumadin, however her INR is somewhat subtherapeutic at 1.55. However given she is on Coumadin, I obtained a CT scan of her pelvis just to make sure that the abscess was not indeed a hematoma and did not extend deeper. By CT scan, there was a fluid collection superficially measuring about 3 x 2 cm. Using ultrasound guidance I was able to perform a needle aspiration of the abscess. I chose not to do an incision with a scalpel do to her Coumadin use. Please see my procedure note. Patient tolerated well. My plan is to send her home with oral antibiotics and she can followup with her primary care physician next week to ensure that the infection is worsening.  INCISION AND DRAINAGE Performed by: Lear Ng. Consent: Verbal consent obtained. Risks and benefits: risks, benefits and alternatives were discussed Type: abscess  Body area: left buttock  Anesthesia: local infiltration  Local anesthetic: lidocaine 2% with epinephrine  Anesthetic total: 1.5 ml  Complexity: simple, needle aspiration with 18 gauge  Drainage: purulent  Drainage amount: 3.5  Packing material: none  Patient tolerance: Patient tolerated the procedure well with no immediate complications.  Dezmin Kittelson Y.          Gavin Pound. Oletta Lamas, MD 03/24/11 1610

## 2011-03-24 NOTE — ED Notes (Signed)
Vital signs stable. 

## 2011-03-24 NOTE — ED Notes (Signed)
MD at bedside. 

## 2011-03-24 NOTE — ED Notes (Signed)
-   Dr. Leander Rams aware of I&D tray  @ bedside..............Marland Kitchen

## 2011-03-24 NOTE — Telephone Encounter (Signed)
Received call from [facility] @ [facility]  Re:  Pt had temp of  101.5  Last night;  99.6 today.  Pt also developed a hard place on left buttock almost near anal area.  Gunnar Fusi stated it was almost like an abscess except very hard.   Pt could hardly sit on left side.    Informed Gunnar Fusi that Dr. Cyndie Chime was not in office today.   Instructed Gunnar Fusi to have pt go to ER for further evaluation  Of increased temp , and abscess like knot on left buttock.   Gunnar Fusi agreed and stated she would inform pt's daughter to take pt to ER. [employer]   Phone    [phone].

## 2011-03-24 NOTE — ED Notes (Signed)
Pt with chronic Leukemia and under hospice care has a sore place on her buttocks that "feels warm to the touch.". Pt's hospice nurse advised her to come to the ED. Pt said pain 8/10 before taking percocet at home.

## 2011-03-27 ENCOUNTER — Ambulatory Visit (HOSPITAL_BASED_OUTPATIENT_CLINIC_OR_DEPARTMENT_OTHER): Payer: Self-pay | Admitting: Pharmacist

## 2011-03-27 ENCOUNTER — Telehealth: Payer: Self-pay | Admitting: *Deleted

## 2011-03-27 DIAGNOSIS — I2699 Other pulmonary embolism without acute cor pulmonale: Secondary | ICD-10-CM

## 2011-03-27 DIAGNOSIS — I809 Phlebitis and thrombophlebitis of unspecified site: Secondary | ICD-10-CM

## 2011-03-27 LAB — PATHOLOGIST SMEAR REVIEW

## 2011-03-27 NOTE — Progress Notes (Signed)
Pt went to ED on 03/24/11 w/ gluteal abscess.  She was given RX for Septra DS 1 tab q12h x 5 days along w/ Clindamycin 300 mg TID x 5 days. Hospice RN Gunnar Fusi; ph# [phone]) called & spoke w/ Tor Netters, RN today to notify us of the recent med change/condition update.  Neilsa then reported the call to me. INR at ED on 03/24/11 = 1.55 taking Coumadin 8 mg/day. I called Alysse.  I reviewed her current medications.  She remains on Prednisone 20 mg/day.  She said she has been taking the Clindamycin since 03/25/11 as prescribed but has only been taking the Septra DS since 03/24/11 once daily rather than q12h.  Her reason for this is that she likes to have all of her meds taken by 8 pm. I convinced her today that she should increase the Septra dose to twice daily so that she doesn't get a blood infection from her abscess given her disease & infection risk.  She states that she will start twice daily Septra today. She should finish Septra 03/30/11 & Clindamycin on 03/29/11. Because her Septra dose is increasing overall, I expect her INR will increase as well.  Therefore, she will empirically back off to Coumadin 8 mg today (2/4) then 6 mg on 2/5 & 2/6. Paula from Hospice has agreed to draw Ms. Dethlefs's INR on 03/30/11 before noon.  She will call us in pharmacy w/ results.  We then will call Ms. Oak w/ instructions on Coumadin dosing.  Please keep in mind days of completing abx as above. Pt already has appts to see [place] on 04/04/11 which she knows to keep. [name], Pharm.D.

## 2011-03-27 NOTE — Telephone Encounter (Signed)
ON 03/24/11,Friday, PT. WAS SEEN IN THE EMERGENCY ROOM FOR A LEFT BUTTOCK ABSCESS. THE ABSCESS WAS ASPIRATED AND PT. WAS PLACED ON TWO ANTIBIOTICS. THE AREA IS SMALLER AND MUCH IMPROVED WITH SOME DRAINAGE. ALSO PT.'S INR WAS LOW. DISCHARGE INSTRUCTIONS WERE FOR PT. TO FOLLOW UP WITH DR.GRANFORTUNA THIS WEEK. DR.GRANFORTUNA IS OUT OF THE OFFICE THIS WEEK. PT. HAS A LAB AND FOLLOW UP APPOINTMENT WITH LISA THOMAS,NP ON 04/04/11. VERBAL ORDER AND READ BACK TO LISA THOMAS,NP- INFORM THE COUMADIN CLINIC ABOUT PT.'S SITUATION. SEE IF HOSPICE NURSE COULD DRAW PT.'S BLOOD AND BRING TO THE OFFICE THIS WEEK. NOTIFIED GINA TUCKER, PHARMACIST. SHE WILL CONTACT PAULA WITH HOSPICE.

## 2011-03-28 NOTE — Progress Notes (Signed)
Pt not doing well. Now with hospice. I would not increase dose  G

## 2011-03-30 ENCOUNTER — Emergency Department (HOSPITAL_COMMUNITY)

## 2011-03-30 ENCOUNTER — Telehealth: Payer: Self-pay | Admitting: *Deleted

## 2011-03-30 ENCOUNTER — Emergency Department (HOSPITAL_COMMUNITY)
Admission: EM | Admit: 2011-03-30 | Discharge: 2011-03-30 | Disposition: A | Discharge status: Home / Self Care | Attending: Emergency Medicine | Admitting: Emergency Medicine

## 2011-03-30 ENCOUNTER — Ambulatory Visit (HOSPITAL_BASED_OUTPATIENT_CLINIC_OR_DEPARTMENT_OTHER): Payer: Medicare Other | Admitting: Pharmacist

## 2011-03-30 ENCOUNTER — Encounter (HOSPITAL_COMMUNITY): Payer: Self-pay | Admitting: *Deleted

## 2011-03-30 DIAGNOSIS — R599 Enlarged lymph nodes, unspecified: Secondary | ICD-10-CM | POA: Insufficient documentation

## 2011-03-30 DIAGNOSIS — I809 Phlebitis and thrombophlebitis of unspecified site: Secondary | ICD-10-CM

## 2011-03-30 DIAGNOSIS — Z9889 Other specified postprocedural states: Secondary | ICD-10-CM | POA: Insufficient documentation

## 2011-03-30 DIAGNOSIS — L03317 Cellulitis of buttock: Secondary | ICD-10-CM | POA: Insufficient documentation

## 2011-03-30 DIAGNOSIS — L0231 Cutaneous abscess of buttock: Secondary | ICD-10-CM | POA: Insufficient documentation

## 2011-03-30 DIAGNOSIS — Z7901 Long term (current) use of anticoagulants: Secondary | ICD-10-CM | POA: Insufficient documentation

## 2011-03-30 DIAGNOSIS — R000 Tachycardia, unspecified: Secondary | ICD-10-CM | POA: Insufficient documentation

## 2011-03-30 DIAGNOSIS — L0291 Cutaneous abscess, unspecified: Secondary | ICD-10-CM

## 2011-03-30 DIAGNOSIS — R161 Splenomegaly, not elsewhere classified: Secondary | ICD-10-CM | POA: Insufficient documentation

## 2011-03-30 DIAGNOSIS — I2699 Other pulmonary embolism without acute cor pulmonale: Secondary | ICD-10-CM

## 2011-03-30 DIAGNOSIS — C9510 Chronic leukemia of unspecified cell type not having achieved remission: Secondary | ICD-10-CM | POA: Insufficient documentation

## 2011-03-30 LAB — CULTURE, ROUTINE-ABSCESS

## 2011-03-30 LAB — BASIC METABOLIC PANEL
Chloride: 101 mEq/L (ref 96–112)
GFR calc Af Amer: 79 mL/min — ABNORMAL LOW (ref >90)
GFR calc non Af Amer: 68 mL/min — ABNORMAL LOW (ref >90)
Potassium: 4.5 mEq/L (ref 3.5–5.1)

## 2011-03-30 LAB — CBC
HCT: 26.1 % — ABNORMAL LOW (ref 36.0–46.0)
Hemoglobin: 7.9 g/dL — ABNORMAL LOW (ref 12.0–15.0)
MCH: 27.4 pg (ref 26.0–34.0)
MCV: 90.6 fL (ref 78.0–100.0)
Platelets: 161 10*3/uL (ref 150–400)
RBC: 2.88 MIL/uL — ABNORMAL LOW (ref 3.87–5.11)

## 2011-03-30 LAB — DIFFERENTIAL
Band Neutrophils: 0 % (ref 0–10)
Basophils Absolute: 0 10*3/uL (ref 0.0–0.1)
Basophils Relative: 0 % (ref 0–1)
Eosinophils Absolute: 0 10*3/uL (ref 0.0–0.7)
Eosinophils Relative: 0 % (ref 0–5)
Metamyelocytes Relative: 0 %
Monocytes Absolute: 1 10*3/uL (ref 0.1–1.0)
Myelocytes: 0 %

## 2011-03-30 MED ORDER — SODIUM CHLORIDE 0.9 % IV SOLN: Freq: Once | INTRAVENOUS | Status: DC

## 2011-03-30 MED ORDER — CLINDAMYCIN HCL 150 MG PO CAPS: 300.0000 mg | ORAL_CAPSULE | Freq: Four times a day (QID) | ORAL | Status: AC

## 2011-03-30 MED ORDER — IOHEXOL 300 MG/ML  SOLN
100.0000 mL | Freq: Once | INTRAMUSCULAR | Status: AC | PRN
  Administered 2011-03-30: 100 mL via INTRAVENOUS

## 2011-03-30 NOTE — ED Notes (Signed)
Patient states she was seen last week for same abscess on buttocks, pt states today hospice nurse was concerned another abscess was forming. Tan drainage noted, from area. MD at bedside for evaluation.

## 2011-03-30 NOTE — ED Notes (Signed)
Pt in c/o abscess to buttock x1 day, states she was recently seen here for same and treated, pt with chronic leukemia, states she was given antibiotics

## 2011-03-30 NOTE — Telephone Encounter (Signed)
VERBAL ORDER AND READ BACK TO DR.SHERRILL- PT. NEEDS TO GO TO THE EMERGENCY ROOM TO HAVE ABSCESS ASPIRATED. ALSO PT. MAY USE MIRALAX  FOR BOWEL ISSUES. NOTIFIED PAULA. SHE VOICES UNDERSTANDING.

## 2011-03-30 NOTE — ED Provider Notes (Signed)
History     CSN: [id number]  Arrival date & time 03/30/11  1514   First MD Initiated Contact with Patient 03/30/11 1545      Chief Complaint  Patient presents with  . Abscess    (Consider location/radiation/quality/duration/timing/severity/associated sxs/prior treatment) Patient is a 71 y.o. female presenting with abscess. The history is provided by the patient.  Abscess    patient here complaining of abscess drainage 2 left buttock. Patient seen in ED 5 days ago for similar symptoms and had a needle guided drainage of the abscess. Patient denies fever, vomiting, diarrhea. Patient has been on outpatient antibiotics without resolution of her symptoms.  Past Medical History  Diagnosis Date  . Leukemia chronic     History reviewed. No pertinent past surgical history.  History reviewed. No pertinent family history.  History  Substance Use Topics  . Smoking status: Not on file  . Smokeless tobacco: Never Used  . Alcohol Use: No    OB History    Grav Para Term Preterm Abortions TAB SAB Ect Mult Living                  Review of Systems  All other systems reviewed and are negative.    Allergies  Review of patient's allergies indicates no known allergies.  Home Medications   Current Outpatient Rx  Name Route Sig Dispense Refill  . ASPIRIN EC 81 MG PO TBEC Oral Take 81 mg by mouth daily.    Marland Kitchen CLINDAMYCIN HCL 150 MG PO CAPS Oral Take 2 capsules (300 mg total) by mouth 3 (three) times daily. 30 capsule 0  . LORAZEPAM 0.5 MG PO TABS Oral Take 0.5 mg by mouth Every 6 hours as needed. For anxiety.    . METFORMIN HCL 500 MG PO TABS Oral Take 500 mg by mouth 2 (two) times daily with a meal.      . PANTOPRAZOLE SODIUM 40 MG PO TBEC Oral Take 40 mg by mouth Daily.    Marland Kitchen PREDNISONE 20 MG PO TABS Oral Take 20 mg by mouth daily.     . SULFAMETHOXAZOLE-TRIMETHOPRIM 800-160 MG PO TABS Oral Take 1 tablet by mouth every 12 (twelve) hours. 10 tablet 0  . TOLTERODINE TARTRATE 2 MG PO  TABS Oral Take 2 mg by mouth 2 (two) times daily.    Marland Kitchen VALACYCLOVIR HCL 1 G PO TABS Oral Take 500 mg by mouth Daily. Takes 1/2 tab daily    . WARFARIN SODIUM 4 MG PO TABS Oral Take 8 mg by mouth daily.    . OXYCODONE HCL 5 MG PO TABS Oral Take 5 mg by mouth every 4 (four) hours as needed. For pain.    Marland Kitchen ZOLPIDEM TARTRATE 5 MG PO TABS Oral Take 1 tablet (5 mg total) by mouth at bedtime as needed for sleep. 30 tablet 0    BP 106/90  Pulse 112  Temp(Src) 98.9 F (37.2 C) (Oral)  Resp 18  SpO2 100%  Physical Exam  Nursing note and vitals reviewed. Constitutional: She is oriented to person, place, and time. She appears well-developed and well-nourished.  Non-toxic appearance. No distress.  HENT:  Head: Normocephalic and atraumatic.  Eyes: Conjunctivae, EOM and lids are normal. Pupils are equal, round, and reactive to light.  Neck: Normal range of motion. Neck supple. No tracheal deviation present. No mass present.  Cardiovascular: Regular rhythm and normal heart sounds.  Tachycardia present.  Exam reveals no gallop.   No murmur heard. Pulmonary/Chest: Effort normal and  breath sounds normal. No stridor. No respiratory distress. She has no decreased breath sounds. She has no wheezes. She has no rhonchi. She has no rales.  Abdominal: Soft. Normal appearance and bowel sounds are normal. She exhibits no distension. There is no tenderness. There is no rebound and no CVA tenderness.  Musculoskeletal: Normal range of motion. She exhibits no edema and no tenderness.       Legs: Neurological: She is alert and oriented to person, place, and time. She has normal strength. No cranial nerve deficit or sensory deficit. GCS eye subscore is 4. GCS verbal subscore is 5. GCS motor subscore is 6.  Skin: Skin is warm and dry. No abrasion and no rash noted.  Psychiatric: She has a normal mood and affect. Her speech is normal and behavior is normal.    ED Course  Procedures (including critical care  time)   Labs Reviewed  CBC  DIFFERENTIAL  BASIC METABOLIC PANEL   No results found.   No diagnosis found.    MDM  Ct results reviewed and improvement of abscess noted--will continue pt on clindamycin        Toy Baker, MD 03/30/11 6578

## 2011-03-30 NOTE — Progress Notes (Signed)
Called patient to discuss INR results obtained by home hospice RN.  INR today = 2.2, PT = 26.1, which are within goal range.  She will complete her course of Septra today.  She finished her clindamycin yesterday.  She has not had problems with bleeding or bruising, but she and her hospice nurse have discovered a new buttock abscess that does not connect with the previous abscess.  She has been advised by Lonna Cobb to go to the ER to have it evaluated, but Ms. Wildes is really reluctant to go.  After much persuasion, she only conceeded that she will talk to her daughter about going after she gets off work.  Pt understands that she should call us if they start her back on antibiotics, as we may need to decrease her dose back to 6mg  daily. Will reassess patient either way on 04/04/11 with her existing appt with Misty Stanley.

## 2011-03-31 ENCOUNTER — Telehealth: Payer: Self-pay | Admitting: Pharmacist

## 2011-03-31 NOTE — Telephone Encounter (Signed)
Pt called stating she went to the ER yesterday. She was given a prescription for an additional 5 days of Clindamycin. She will take her last dose of Septra today. I instructed patient to increase her Coumadin back to 8mg  daily. We will recheck her INR on 04/04/11 as previously planned.

## 2011-04-03 ENCOUNTER — Other Ambulatory Visit: Payer: Self-pay | Admitting: Pharmacist

## 2011-04-03 DIAGNOSIS — I2699 Other pulmonary embolism without acute cor pulmonale: Secondary | ICD-10-CM

## 2011-04-04 ENCOUNTER — Ambulatory Visit (HOSPITAL_BASED_OUTPATIENT_CLINIC_OR_DEPARTMENT_OTHER): Payer: Medicare Other | Admitting: Nurse Practitioner

## 2011-04-04 ENCOUNTER — Other Ambulatory Visit (HOSPITAL_BASED_OUTPATIENT_CLINIC_OR_DEPARTMENT_OTHER): Payer: Medicare Other | Admitting: Lab

## 2011-04-04 ENCOUNTER — Other Ambulatory Visit: Payer: Self-pay | Admitting: Pharmacist

## 2011-04-04 ENCOUNTER — Ambulatory Visit: Payer: Medicare Other | Admitting: Pharmacist

## 2011-04-04 ENCOUNTER — Encounter (HOSPITAL_COMMUNITY)
Admission: RE | Admit: 2011-04-04 | Discharge: 2011-04-04 | Disposition: A | Discharge status: Home / Self Care | Source: Ambulatory Visit | Attending: Oncology | Admitting: Oncology

## 2011-04-04 VITALS — BP 132/72 | HR 113 | Temp 97.0°F | Ht 63.0 in | Wt 231.3 lb

## 2011-04-04 DIAGNOSIS — I2699 Other pulmonary embolism without acute cor pulmonale: Secondary | ICD-10-CM

## 2011-04-04 DIAGNOSIS — D649 Anemia, unspecified: Secondary | ICD-10-CM | POA: Insufficient documentation

## 2011-04-04 DIAGNOSIS — C9110 Chronic lymphocytic leukemia of B-cell type not having achieved remission: Secondary | ICD-10-CM | POA: Insufficient documentation

## 2011-04-04 DIAGNOSIS — Z7901 Long term (current) use of anticoagulants: Secondary | ICD-10-CM

## 2011-04-04 LAB — MANUAL DIFFERENTIAL
ALC: 136.8 10*3/uL — ABNORMAL HIGH (ref 0.9–3.3)
ANC (CHCC manual diff): 1.4 10*3/uL — ABNORMAL LOW (ref 1.5–6.5)
Blasts: 0 % (ref 0–0)
EOS: 0 % (ref 0–7)
LYMPH: 98 % — ABNORMAL HIGH (ref 14–49)
MONO: 1 % (ref 0–14)
Other Cell: 0 % (ref 0–0)
PROMYELO: 0 % (ref 0–0)
nRBC: 0 % (ref 0–0)

## 2011-04-04 LAB — PROTIME-INR
INR: 1.6 — ABNORMAL LOW (ref 2.00–3.50)
Protime: 19.2 Seconds — ABNORMAL HIGH (ref 10.6–13.4)

## 2011-04-04 LAB — CBC WITH DIFFERENTIAL/PLATELET
HCT: 24.6 % — ABNORMAL LOW (ref 34.8–46.6)
HGB: 7.4 g/dL — ABNORMAL LOW (ref 11.6–15.9)
Platelets: 118 10*3/uL — ABNORMAL LOW (ref 145–400)
RBC: 2.62 10*6/uL — ABNORMAL LOW (ref 3.70–5.45)
WBC: 139.6 10*3/uL (ref 3.9–10.3)

## 2011-04-04 LAB — PREPARE RBC (CROSSMATCH)

## 2011-04-04 MED ORDER — WARFARIN SODIUM 4 MG PO TABS: 8.0000 mg | ORAL_TABLET | Freq: Every day | ORAL | Status: AC

## 2011-04-04 MED ORDER — ENOXAPARIN SODIUM 150 MG/ML ~~LOC~~ SOLN
150.0000 mg | SUBCUTANEOUS | Status: DC
  Administered 2011-04-04: 150 mg via SUBCUTANEOUS
  Filled 2011-04-04: qty 1

## 2011-04-04 NOTE — Progress Notes (Signed)
Pt off Septra RX but Lonna Cobb restarting prophylactic dose tomorrow (qMWF). Given fact INR is low & pt has h/o clotting at low INR, we will begin Lovenox 150 mg daily today in clinic & continue w/ Hospice RN at pts home (Lonna Cobb will notify Hospice). Boost w/ 10 mg Coumadin today then 8 mg/day. Repeat INR on Friday when pt comes in for blood transfusion. Lonna Cobb, NP aware of above plan. [name], Pharm.D.

## 2011-04-04 NOTE — Progress Notes (Signed)
OFFICE PROGRESS NOTE  Interval history:  [name] is a 71 year old woman with CLL. She has been heavily treated. A hospice referral was made following her visit 03/19/2011.  She was evaluated in the Emergency Department on 03/24/2011 due to pain at the left buttock. Pelvic CT demonstrated a small gluteal subcutaneous abscess just to the left of the gluteal cleft. Increasing adenopathy in the pelvis was also noted. The abscess was aspirated and she was discharged home on a course of clindamycin. She presented to the emergency Department again on 03/30/2011 with complaints of drainage from the abscess. Repeat CT scan showed the medial left gluteal abscess now contained only gas with stable or mildly diminished surrounding inflammatory stranding. The course of clindamycin was extended.  She presents today for scheduled followup. [name] reports the pain associated with the abscess has improved. She intermittently notes drainage. She recently completed a course of clindamycin. Fevers resolved with a course of antibiotics. She continues to have intermittent sweats. She has dyspnea on exertion. Energy level is poor. She feels she needs a blood transfusion.     Objective: Blood pressure 132/72, pulse 113, temperature 97 F (36.1 C), temperature source Oral, height 5\' 3"  (1.6 m), weight 231 lb 4.8 oz (104.917 kg).  Oropharynx is without thrush or ulceration. Bilateral cervical, supra-clavicular and axillary lymph nodes appear smaller. Lungs are clear. Regular cardiac rhythm. Right upper should be port site is without erythema. Abdomen is soft, obese. Extremities are without edema. Wound at the medial left buttock appears clean. There is no surrounding erythema.  Lab Results: Lab Results  Component Value Date   WBC 139.6* 04/04/2011   HGB 7.4* 04/04/2011   HCT 24.6* 04/04/2011   MCV 94.0 04/04/2011   PLT 118* 04/04/2011    Chemistry:    Chemistry      Component Value Date/Time   NA 133* 03/30/2011  1641   K 4.5 03/30/2011 1641   CL 101 03/30/2011 1641   CO2 19 03/30/2011 1641   BUN 17 03/30/2011 1641   CREATININE 0.84 03/30/2011 1641      Component Value Date/Time   CALCIUM 10.3 03/30/2011 1641   ALKPHOS 231* 02/28/2011 1555   AST 17 02/28/2011 1555   ALT 23 02/28/2011 1555   BILITOT 0.6 02/28/2011 1555       Studies/Results: Ct Pelvis W Contrast  03/24/2011  *RADIOLOGY REPORT*  Clinical Data:  Pelvic abscess.  Chronic leukemia.  CT PELVIS WITH CONTRAST  Technique:  Multidetector CT imaging of the pelvis was performed using the standard protocol following the bolus administration of intravenous contrast.  Contrast: OMNIPAQUE IOHEXOL 300 MG/ML IV SOLN  Comparison:  07/21/2010 CT abdomen and pelvis.  Findings:  Bulky adenopathy is present in the pelvis which has progressed compared to the prior exam.  Reference nodes of [place] appear larger than on prior, with the left measuring 67 mm x 25 mm, previously 14 mm x 38 mm.  Enlargement of the iliac, mesenteric and retroperitoneal nodes is also present.  There is induration of the subcutaneous tissues to the left of the gluteal cleft.  Gas is present in the subcutaneous tissues compatible with a small abscess that measures 30 mm AP, 18 mm transverse and 27 mm cranial-caudal.  This is very superficial to the gluteal cleft.  It is unclear whether there is a tract extending to the scan.  The deep pelvis is within normal limits. No perirectal abscess.  Issue rectal fossa appears within normal limits.  Small fat  containing periumbilical hernia is present.  Lumbar spondylosis.  No aggressive osseous lesions. There is effacement of normal fatty marrow in the proximal femoral shafts, also consistent with hematalogic malignancy.  Atherosclerosis is present.  IMPRESSION: 1.  Small gluteal subcutaneous abscess is superficially located, just to the left of the gluteal cleft.  Abscess measures 30 mm x 18 mm x 27 mm. 2.  Increasing adenopathy in the pelvis compatible with  relapse of chronic lymphocytic leukemia.  Original Report Authenticated By: Andreas Newport, M.D.   Ct Abdomen Pelvis W Contrast  03/30/2011  *RADIOLOGY REPORT*  Clinical Data: 71 year old female with recent gluteal subcutaneous abscess.  Leukemia.  CT ABDOMEN AND PELVIS WITH CONTRAST  Technique:  Multidetector CT imaging of the abdomen and pelvis was performed following the standard protocol during bolus administration of intravenous contrast.  Contrast: OMNIPAQUE IOHEXOL 300 MG/ML IV SOLN  Comparison: 03/24/2011.  Findings: Negative lung bases except for minor atelectasis.  Mild L1 compression fracture affecting the superior endplate is new since 2012.  No other acute osseous abnormality identified.  Recently seen left medial gluteal subcutaneous abscess now contains only gas.  Surrounding fat stranding is stable or mildly decreased. No new inflammatory change in the region of the peroneum is identified.  The presacral space remains normal except for small lymph nodes. No pelvic free fluid.  Negative rectum.  Unremarkable bladder.  Uterus and adnexa surgically absent. Retained stool throughout the colon. No dilated small bowel.  Normal appendix.  Stomach and duodenum are nondilated containing fluid.  Bilateral iliac chain lymphadenopathy.  Individual nodes measure up to 32 mm in short axis and has significantly progressed since 07/21/2010, stable more recently.  These are contiguous with bulky retroperitoneal adenopathy, greater on the left where a conglomerate nodal mass below the left kidney measures 35 x 48 mm (14 x 34 mm in 2012).  This adenopathy continues to the celiac axis and root of the mesentery.  Bulky porta hepatis and gastrohepatic ligament lymph nodes also are increased.  Small bowel mesenteric nodes are increased. Retrocrural lymph nodes are mildly increased.  Splenomegaly is increased in the spleen is mildly heterogeneous. Estimated splenic volume is 459 ml (Normal splenic volume range 83 -  412 mL).  No abdominal free fluid. Negative pancreas, liver, gallbladder, adrenal glands, and kidneys.  Portal venous system and major arterial structures appear to be patent.  IMPRESSION: 1.  Medial left gluteal abscess now contains only gas, with stable or mildly diminished surrounding inflammatory stranding. 2.  Diffuse abdominal and pelvic lymphadenopathy with significant progression since 07/21/2010 compatible with leukemia/lymphoma. 3.  Increased splenic size with splenomegaly.  Splenic involvement by the lymphoproliferative disorder not excluded. 4.  Mild L1 compression fracture is new since 2012 and could be acute or subacute.  Original Report Authenticated By: Harley Hallmark, M.D.    Medications: I have reviewed the patient's current medications.  Assessment/Plan:  1. Long-standing chronic lymphocytic leukemia, heavily treated, most recently with a trial of Revlimid. She tolerated the Revlimid poorly. A hospice referral was made following her visit 03/14/2011. 2. Hospitalization 12/08/2010 through 12/14/2010 with culture negative fever/neutropenia, status post a 7 day course of intravenous antibiotics. 3. Fevers and night sweats, secondary to chronic lymphocytic leukemia. 4. History of deep vein thrombosis/recurrent pulmonary embolism. The INR is subtherapeutic. She will begin Lovenox 150 mg daily and we will repeat the PT/INR on 04/07/2011. Coumadin dose adjusted per pharmacy. 5. Type 2 diabetes. 6. 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 She also had a sinus drainage procedure. 7. Advanced degenerative arthritis. 8. Left gluteal subcutaneous abscess status post course of clindamycin. 9. 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 She will return for a blood transfusion on 04/07/2011.  Disposition-[name] appears stable. We instructed her to resume prophylactic Septra on a Monday Wednesday Friday  schedule. She will return for a blood transfusion on 04/07/2011. She will return for a followup visit with Dr. Cyndie Chime in 4 weeks. She will contact the office the interim with any problems.  Plan reviewed with Dr. Cyndie Chime.  Lonna Cobb ANP/GNP-BC

## 2011-04-05 ENCOUNTER — Other Ambulatory Visit: Payer: Self-pay | Admitting: Pharmacist

## 2011-04-07 ENCOUNTER — Ambulatory Visit (HOSPITAL_BASED_OUTPATIENT_CLINIC_OR_DEPARTMENT_OTHER): Payer: Medicare Other | Admitting: Pharmacist

## 2011-04-07 ENCOUNTER — Other Ambulatory Visit: Payer: Self-pay | Admitting: Nurse Practitioner

## 2011-04-07 ENCOUNTER — Ambulatory Visit (HOSPITAL_BASED_OUTPATIENT_CLINIC_OR_DEPARTMENT_OTHER): Payer: Medicare Other

## 2011-04-07 ENCOUNTER — Ambulatory Visit: Payer: Medicare Other

## 2011-04-07 ENCOUNTER — Other Ambulatory Visit: Payer: Self-pay | Admitting: *Deleted

## 2011-04-07 ENCOUNTER — Telehealth: Payer: Self-pay | Admitting: *Deleted

## 2011-04-07 ENCOUNTER — Ambulatory Visit: Payer: Medicare Other | Admitting: Lab

## 2011-04-07 ENCOUNTER — Encounter (HOSPITAL_COMMUNITY)
Admission: RE | Admit: 2011-04-07 | Discharge: 2011-04-07 | Disposition: A | Discharge status: Home / Self Care | Payer: Medicare Other | Source: Ambulatory Visit | Attending: Oncology | Admitting: Oncology

## 2011-04-07 ENCOUNTER — Other Ambulatory Visit: Payer: Medicare Other | Admitting: Lab

## 2011-04-07 VITALS — BP 119/61 | HR 103 | Temp 98.0°F | Resp 20

## 2011-04-07 DIAGNOSIS — C9590 Leukemia, unspecified not having achieved remission: Secondary | ICD-10-CM

## 2011-04-07 DIAGNOSIS — C9110 Chronic lymphocytic leukemia of B-cell type not having achieved remission: Secondary | ICD-10-CM

## 2011-04-07 DIAGNOSIS — I2699 Other pulmonary embolism without acute cor pulmonale: Secondary | ICD-10-CM

## 2011-04-07 DIAGNOSIS — D649 Anemia, unspecified: Secondary | ICD-10-CM

## 2011-04-07 LAB — TYPE AND SCREEN
ABO/RH(D): B POS
Antibody Screen: NEGATIVE
Unit division: 0

## 2011-04-07 LAB — PREPARE RBC (CROSSMATCH)

## 2011-04-07 LAB — POCT INR: INR: 1.5

## 2011-04-07 LAB — HOLD TUBE, BLOOD BANK

## 2011-04-07 MED ORDER — ACETAMINOPHEN 325 MG PO TABS
650.0000 mg | ORAL_TABLET | Freq: Once | ORAL | Status: AC
  Administered 2011-04-07: 650 mg via ORAL

## 2011-04-07 NOTE — Patient Instructions (Addendum)
Continue Lovenox 150 mg SQ daily.   Take Coumadin 10 mg x 1 today only then begin 9 mg/day starting on 04/08/11. Recheck INR 04/11/11. Zerita Boers [facility] RN) will give the Lovenox injections on 2/16-2/19. Gunnar Fusi will check INR on 04/11/11 and call results to the pharmacist at [phone].

## 2011-04-07 NOTE — Telephone Encounter (Signed)
Message left on [place] G RN/Hospice/[place] to set up pt for O2 per orders from Dr. Cyndie Chime.

## 2011-04-07 NOTE — Progress Notes (Signed)
Continue Lovenox 150 mg SQ daily.   Take Coumadin 10 mg x 1 today only then begin 9 mg/day starting on 04/08/11. Recheck INR 04/11/11. Zerita Boers [facility] RN) will give the Lovenox injections on 2/16-2/19. Gunnar Fusi will check INR on 04/11/11 and call results to the pharmacist at [phone]. Gunnar Fusi may be reached on her cell: [phone]

## 2011-04-07 NOTE — Progress Notes (Signed)
1335-Pt complaining of feeling as though she is wheezing with respirations.  Has been coughing for 2 days per patient.  Wheezing noted on auscultation.  Lonna Cobb NP notified and to infusion room to assess pt.  Order received to give pt an albuterol nebulizer.  Given at 1343.  Pt states that she feels "much better" after nebulizer.  Wheezing decreased.  Will continue to monitor patient.

## 2011-04-08 LAB — TYPE AND SCREEN

## 2011-04-09 MED ORDER — WARFARIN SODIUM 5 MG PO TABS: 5.0000 mg | ORAL_TABLET | ORAL | Status: DC

## 2011-04-10 ENCOUNTER — Telehealth: Payer: Self-pay | Admitting: Pharmacist

## 2011-04-10 NOTE — Telephone Encounter (Signed)
OK thanks - she is declining fast - will aim for INR 2-2.5

## 2011-04-10 NOTE — Telephone Encounter (Signed)
Pt called to let us know that the [facility] on [address] didn't receive the VM for the prescription for her Warfarin 5mg  tablets. The 5mg  tablets were called in to her pharmacy so the patient would have ease of dosing for taking warfarin 9mg  daily. The prescription was called in on 04/07/11. Crystalmarie was concerned because the only tablets she has are the Warfarin (generic) 4mg  tablets and Coumadin (brand name samples) 10mg  tablets. She was concerned about combining the brand and generic tablets in the same dose. I told her it would be ok to combine the brand and generic tablets in the same dose. Her INR is being monitored closely She took 10mg  as instructed on 04/07/11. Then she took 9mg  on 04/08/11 (she took 1/2 the 10mg  brand tablet and one 4mg  generic tablet = 9mg ). She took 8mg  on 04/09/11. I instructed her to take 10mg  today. We will see what her INR is on 04/11/11. If the 5mg  tablets are needed, the rx will need to be called in to her pharmacy again. I have removed it from her profile.

## 2011-04-11 ENCOUNTER — Other Ambulatory Visit: Payer: Self-pay | Admitting: Pharmacist

## 2011-04-11 ENCOUNTER — Telehealth: Payer: Self-pay

## 2011-04-11 ENCOUNTER — Ambulatory Visit: Payer: Self-pay | Admitting: Pharmacist

## 2011-04-11 DIAGNOSIS — I809 Phlebitis and thrombophlebitis of unspecified site: Secondary | ICD-10-CM

## 2011-04-11 DIAGNOSIS — I2699 Other pulmonary embolism without acute cor pulmonale: Secondary | ICD-10-CM

## 2011-04-11 DIAGNOSIS — C9110 Chronic lymphocytic leukemia of B-cell type not having achieved remission: Secondary | ICD-10-CM

## 2011-04-11 DIAGNOSIS — Z8672 Personal history of thrombophlebitis: Secondary | ICD-10-CM

## 2011-04-11 LAB — POCT INR: INR: 2.5

## 2011-04-11 MED ORDER — HYDROCOD POLST-CHLORPHEN POLST 10-8 MG/5ML PO LQCR: 5.0000 mL | Freq: Two times a day (BID) | ORAL | Status: AC | PRN

## 2011-04-11 MED ORDER — WARFARIN SODIUM 5 MG PO TABS: ORAL_TABLET | ORAL | Status: AC

## 2011-04-11 NOTE — Telephone Encounter (Signed)
Received call from [place], [facility] RN with an update on pt.  Per Gunnar Fusi, pt complains of coughing keeping her up at night.  Pt has been taking Tusssionex with relief. Gunnar Fusi notes rales in bilateral lung bases and states her respirations are 26/min.  Denies fever with temp of 98 at today's visit.  O2 sats are 97%, but breathing is labored and "wearing her out."  Hospice is delivering O2 today.   Requests refill on Tussionex if Dr Cyndie Chime agrees with pt continuing & questions if MD has any further orders.

## 2011-04-11 NOTE — Progress Notes (Signed)
INR at goal over 4 day time span.  Noted dose alterations in telephone document from Anola Gurney, [place].D yesterday 04/10/11. MD has lowered INR goal to 2-2.5 as pt declining status. Lower Coumadin dose to 9 mg/day; 8 mg today & Thurs. Discontinue Lovenox. Pt only has Coumadin 10 mg tabs at home & 4 mg Warfarin.  I will call in Warfarin 5 mg to her pharmacy today.  Pt aware. Repeat protime in 1 week when she is scheduled for lab here already. [name], Pharm.D.

## 2011-04-13 ENCOUNTER — Other Ambulatory Visit: Payer: Self-pay | Admitting: *Deleted

## 2011-04-13 ENCOUNTER — Telehealth: Payer: Self-pay | Admitting: *Deleted

## 2011-04-13 NOTE — Telephone Encounter (Signed)
Pt. Left vm 04/12/11 that she was concerned that hospice nurse is doing one thing & we are doing something else.  She reports that we called in a script but the drug store doesn't have it & request a call back.  Returned call to pt this am & unable to reach pt.  Returned call @ 1210pm & she states tussionex was to be called in but pharmacy told her they didn't have it.  Talked with pharmacy & they did have script but stated that it would be out of pocket b/c hospice wasn't covering.  Notified pt of this.

## 2011-04-18 ENCOUNTER — Other Ambulatory Visit: Payer: Medicare Other | Admitting: Lab

## 2011-04-18 ENCOUNTER — Ambulatory Visit (HOSPITAL_BASED_OUTPATIENT_CLINIC_OR_DEPARTMENT_OTHER): Payer: Medicare Other | Admitting: Pharmacist

## 2011-04-18 DIAGNOSIS — Z8672 Personal history of thrombophlebitis: Secondary | ICD-10-CM

## 2011-04-18 DIAGNOSIS — I2699 Other pulmonary embolism without acute cor pulmonale: Secondary | ICD-10-CM

## 2011-04-18 DIAGNOSIS — C9110 Chronic lymphocytic leukemia of B-cell type not having achieved remission: Secondary | ICD-10-CM

## 2011-04-18 DIAGNOSIS — I809 Phlebitis and thrombophlebitis of unspecified site: Secondary | ICD-10-CM

## 2011-04-18 LAB — MANUAL DIFFERENTIAL
ALC: 114.3 10*3/uL — ABNORMAL HIGH (ref 0.9–3.3)
ANC (CHCC manual diff): 1.2 10*3/uL — ABNORMAL LOW (ref 1.5–6.5)
Band Neutrophils: 0 % (ref 0–10)
Blasts: 0 % (ref 0–0)
LYMPH: 98 % — ABNORMAL HIGH (ref 14–49)
MONO: 1 % (ref 0–14)
Metamyelocytes: 0 % (ref 0–0)
Variant Lymph: 0 % (ref 0–0)
nRBC: 0 % (ref 0–0)

## 2011-04-18 LAB — CBC WITH DIFFERENTIAL/PLATELET
HCT: 28.1 % — ABNORMAL LOW (ref 34.8–46.6)
MCHC: 31 g/dL — ABNORMAL LOW (ref 31.5–36.0)
MCV: 91.3 fL (ref 79.5–101.0)
Platelets: 71 10*3/uL — ABNORMAL LOW (ref 145–400)
RBC: 3.07 10*6/uL — ABNORMAL LOW (ref 3.70–5.45)
WBC: 116.6 10*3/uL (ref 3.9–10.3)

## 2011-04-18 LAB — POCT INR: INR: 4.1

## 2011-04-18 LAB — PROTIME-INR

## 2011-04-18 NOTE — Progress Notes (Signed)
INR supratherapeutic (4.1).  Pt and daughter state that she took the Coumadin as per their notes at the last visit, but were unaware that Ms. Lafata was supposed to take 8mg  on Tuesday and Thursday.  They claimed that her dose has been 9mg  daily for the last 2 weeks.  They deny any changes in her medications.  She is now off antibiotics for her gluteal abcess, but has resumed taking Bactrim on MWF.  Unclear if patient and daughter will be able to keep track of different daily doses of Coumadin in the future.  Instructed them to hold Coumadin today, then resume at lower dose of 8mg  daily.  Will recheck INR in 6 days.

## 2011-04-19 ENCOUNTER — Telehealth: Payer: Self-pay | Admitting: *Deleted

## 2011-04-19 NOTE — Telephone Encounter (Signed)
Per Heather/Pharmacist [facility], Dr.Granfortuna did send her a message to stop coumadin & start lovenox 40mg  SQ daily.  Discussed again with Dr. Cyndie Chime & order received for PT/INR for early fri am & check on bleeding status.  Abigail Butts RN/Hospice @ [phone] & order given for PT/INR for early fri am 04/21/11 & she will call result to [facility] & let us know status of bleeding & whether she has any lovenox at home.  Pharmacy reports that we do have some 40mg  dose available.  Gunnar Fusi reports that she was with pt @ 1 1/2 hour & she had her take her pain med & her coughing decreased while she was there so she has encouraged pt to take her pain med on a regular basis.

## 2011-04-19 NOTE — Telephone Encounter (Signed)
Pt left vm that she has been coughing several weeks & has been coughing up blood since tues.  She reports that it is a small amt & pink & she can taste it.  She reports that she feels weak & out of it & her chest & back hurts & the cough med is not really helping.  She is using tussionex bid & has oxycodone for pain.  Returned call & pt reports that Heather/Pharmicist talked with Lonna Cobb NP yest & she was told to hold her coumadin yest to see if that would help.  She reports that it may have made some difference b/c she only had a small amt of pink this am.  She reports that her Hospice nurse will be out today.  This will be discussed with Dr. Cyndie Chime.

## 2011-04-19 NOTE — Telephone Encounter (Signed)
Discussed with Dr. Cyndie Chime & he reports that pt should stay off of coumadin & pt's daughter was notified of this & will discuss with Heather/Pharmacist for coumadin clinic.   Received call from Paula/RN/Hospice/[place] stating that pt's lungs are wet & she has rales up & down.  She would like a script for roxanol to have in the home, not necessarily for now but possibly in the next couple of weeks & would like to schedule her PT/INR at home because it took a lot out of the pt yest coming in the office. She would also like Dr. Cyndie Chime to call the pt per daughter's request because she feels abandoned by Dr Cyndie Chime after all these years.  This nurse spoke personally to the daughter & explained that we can understand this feeling but that we work very closely with Hospice to take care of the pt & we haven't abandoned her.  She would still like Dr. Cyndie Chime to call her mother.

## 2011-04-20 ENCOUNTER — Other Ambulatory Visit: Payer: Self-pay | Admitting: Oncology

## 2011-04-20 ENCOUNTER — Other Ambulatory Visit: Payer: Self-pay | Admitting: *Deleted

## 2011-04-20 DIAGNOSIS — Z8672 Personal history of thrombophlebitis: Secondary | ICD-10-CM

## 2011-04-20 DIAGNOSIS — C9110 Chronic lymphocytic leukemia of B-cell type not having achieved remission: Secondary | ICD-10-CM

## 2011-04-20 MED ORDER — ENOXAPARIN SODIUM 40 MG/0.4ML ~~LOC~~ SOLN: 40.0000 mg | Freq: Every day | SUBCUTANEOUS | Status: AC

## 2011-04-20 MED ORDER — MORPHINE SULFATE (CONCENTRATE) 20 MG/ML PO SOLN: 5.0000 mg | ORAL | Status: DC | PRN

## 2011-04-20 NOTE — Telephone Encounter (Signed)
Called pt & Hospice nurse, Gunnar Fusi & reported that lovenox script sent in to start Sat. if no bleeding per Dr. Cyndie Chime.  Roxanol script also faxed to [facility], [address]  They will have to order & will be in mid next wk & this was OK with Gunnar Fusi.  She does report that hospice benefits will cover lovenox. Encouraged pt to take her pain med & cough med to see if that will help her cough & also suggested humidified O2 to [place] in case the O2 was really drying he out.

## 2011-04-21 ENCOUNTER — Ambulatory Visit: Payer: Self-pay | Admitting: Pharmacist

## 2011-04-21 ENCOUNTER — Telehealth: Payer: Self-pay | Admitting: Pharmacist

## 2011-04-21 ENCOUNTER — Telehealth: Payer: Self-pay | Admitting: *Deleted

## 2011-04-21 DIAGNOSIS — I809 Phlebitis and thrombophlebitis of unspecified site: Secondary | ICD-10-CM

## 2011-04-21 DIAGNOSIS — I2699 Other pulmonary embolism without acute cor pulmonale: Secondary | ICD-10-CM

## 2011-04-21 NOTE — Progress Notes (Signed)
I called [facility].  She did not draw a protime today because Dr. Cyndie Chime gave instructions to hold on the protime draw late yesterday. He would like pt to start Lovenox when the bleeding stops.   I talked w/ [name] over the phone today & she still has had some bleeding today (light pink).  At [facility] instruction, she will call Hospice when the bleeding stops.  The Lovenox will then begin at prophylactic dose 40 mg/day. I have archived pt in D[facility] & discontinued future protime labs. It has been an honor to take care of this lady.   [name], Pharm.D.

## 2011-04-21 NOTE — Telephone Encounter (Signed)
Pt's son Inell Mimbs called and would like to know of pt's progress and to see if he needs to come home soon.   Message relayed to md. Edward's   Phone      [phone].

## 2011-04-24 ENCOUNTER — Other Ambulatory Visit: Payer: Self-pay | Admitting: *Deleted

## 2011-04-24 ENCOUNTER — Other Ambulatory Visit: Payer: Medicare Other | Admitting: Lab

## 2011-04-24 ENCOUNTER — Ambulatory Visit: Payer: Medicare Other

## 2011-04-24 ENCOUNTER — Telehealth: Payer: Self-pay | Admitting: *Deleted

## 2011-04-24 DIAGNOSIS — C9110 Chronic lymphocytic leukemia of B-cell type not having achieved remission: Secondary | ICD-10-CM

## 2011-04-24 MED ORDER — MORPHINE SULFATE (CONCENTRATE) 20 MG/ML PO SOLN: 5.0000 mg | ORAL | Status: AC | PRN

## 2011-04-24 NOTE — Telephone Encounter (Signed)
Sherrie called reporting patient is now agreeable to use of roxanol.  She is declining.  Weaker, not moving and not eating well.  Today heart rate up to 120, resp. =  38 and she is struggling.  On room air O2 sat = 88% so she is now wearing oxygen.  Fax script to [facility] on [address] [phone] Fax [phone].  Will notify providers.

## 2011-04-25 ENCOUNTER — Telehealth: Payer: Self-pay | Admitting: Oncology

## 2011-04-25 NOTE — Telephone Encounter (Signed)
Scheduled pt for CT on 3/7, called pt , talked to Hospice nurse, she informed me that pt is dying and will not make it to 3/7, she suggested that I cancel CT, informed [place] RN for Dr. Cyndie Chime.

## 2011-04-27 ENCOUNTER — Other Ambulatory Visit (HOSPITAL_COMMUNITY): Payer: Medicare Other

## 2011-05-05 ENCOUNTER — Other Ambulatory Visit: Payer: Medicare Other | Admitting: Lab

## 2011-05-05 ENCOUNTER — Ambulatory Visit: Payer: Medicare Other | Admitting: Oncology

## 2011-05-22 DEATH — deceased

## 2011-10-02 IMAGING — CR DG CHEST 2V
2 series · 2 of 2 positions shown · non-contrast
Comparison: 04/29/2010

CLINICAL DATA: Chronic cough

CHEST - 2 VIEW

[w chest lat *]
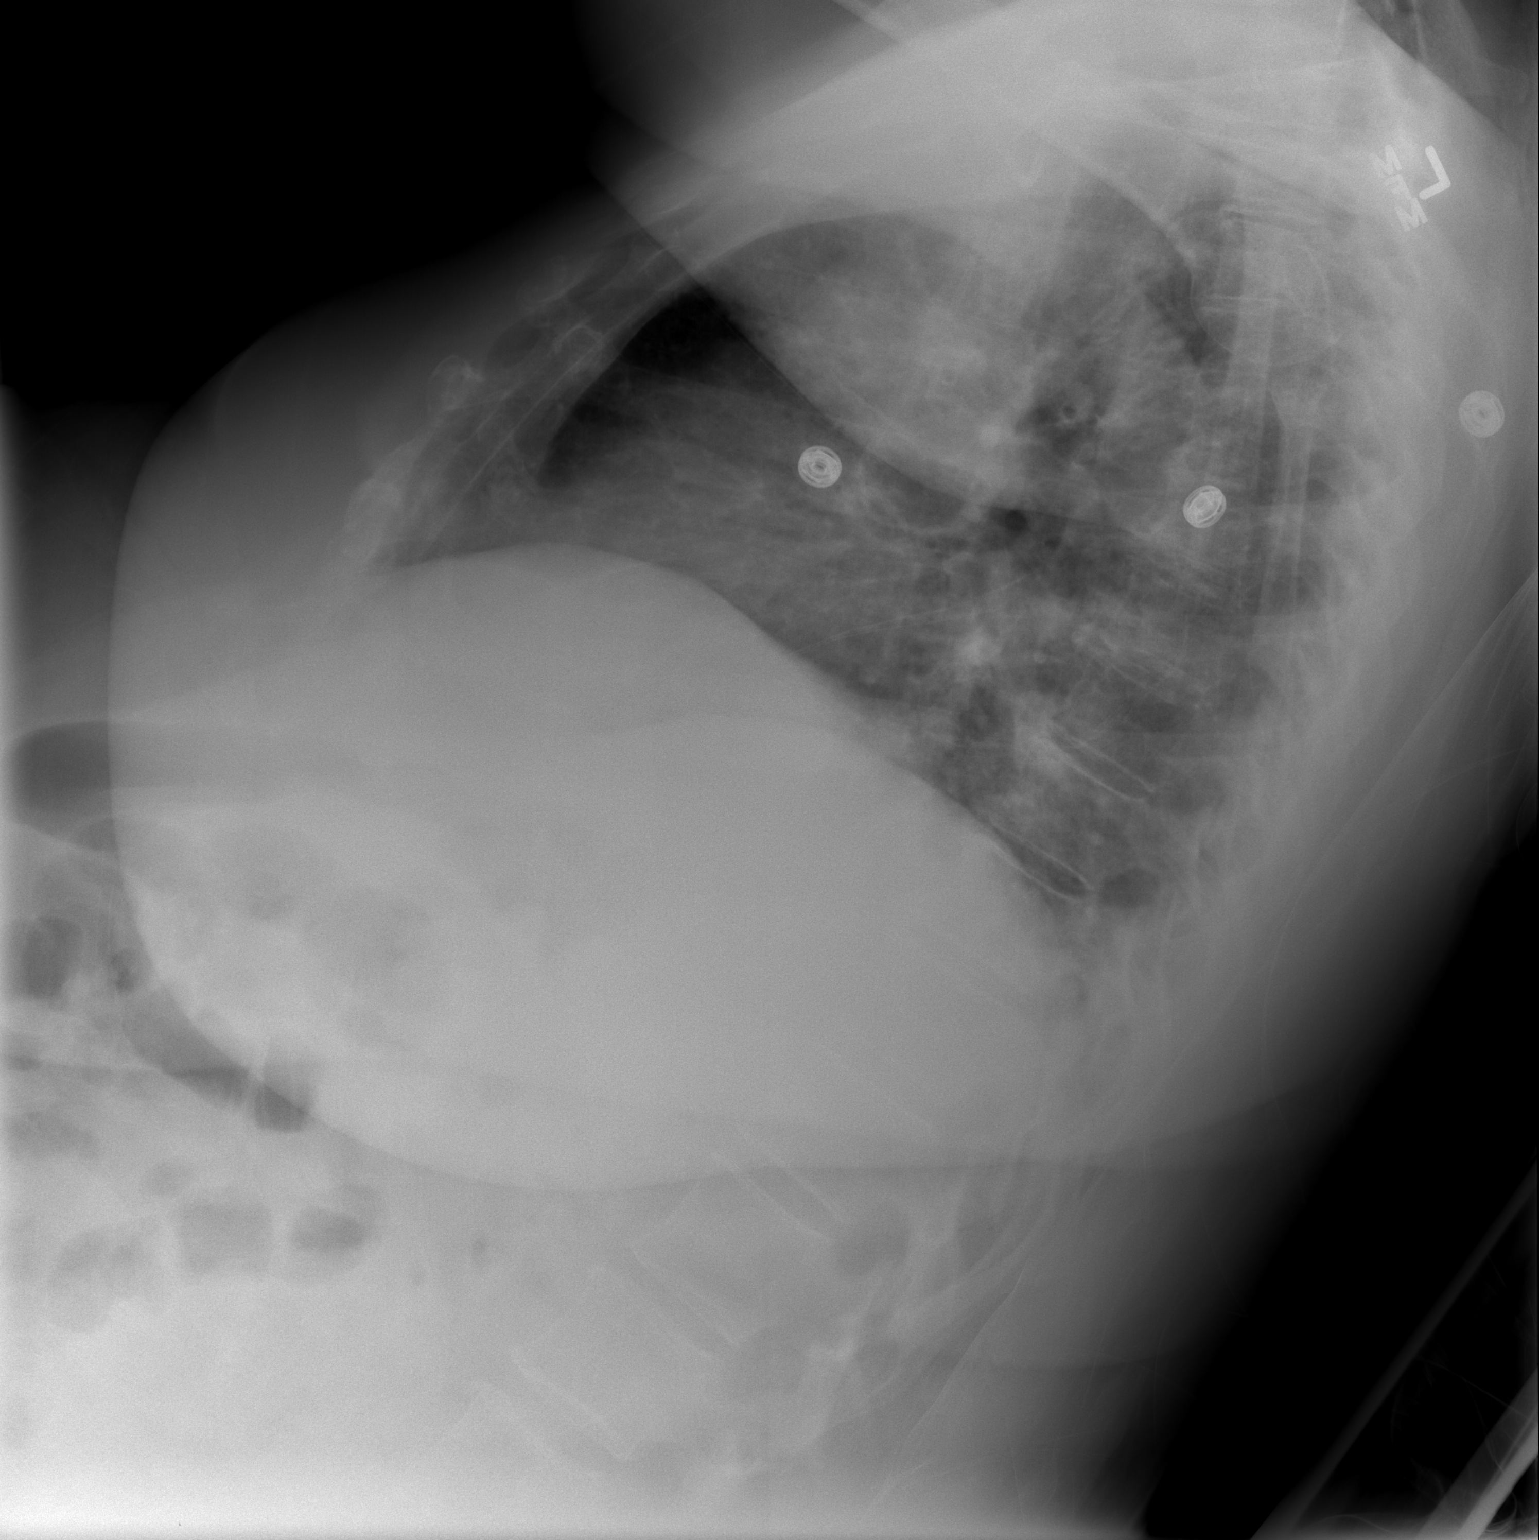

[view not recorded]
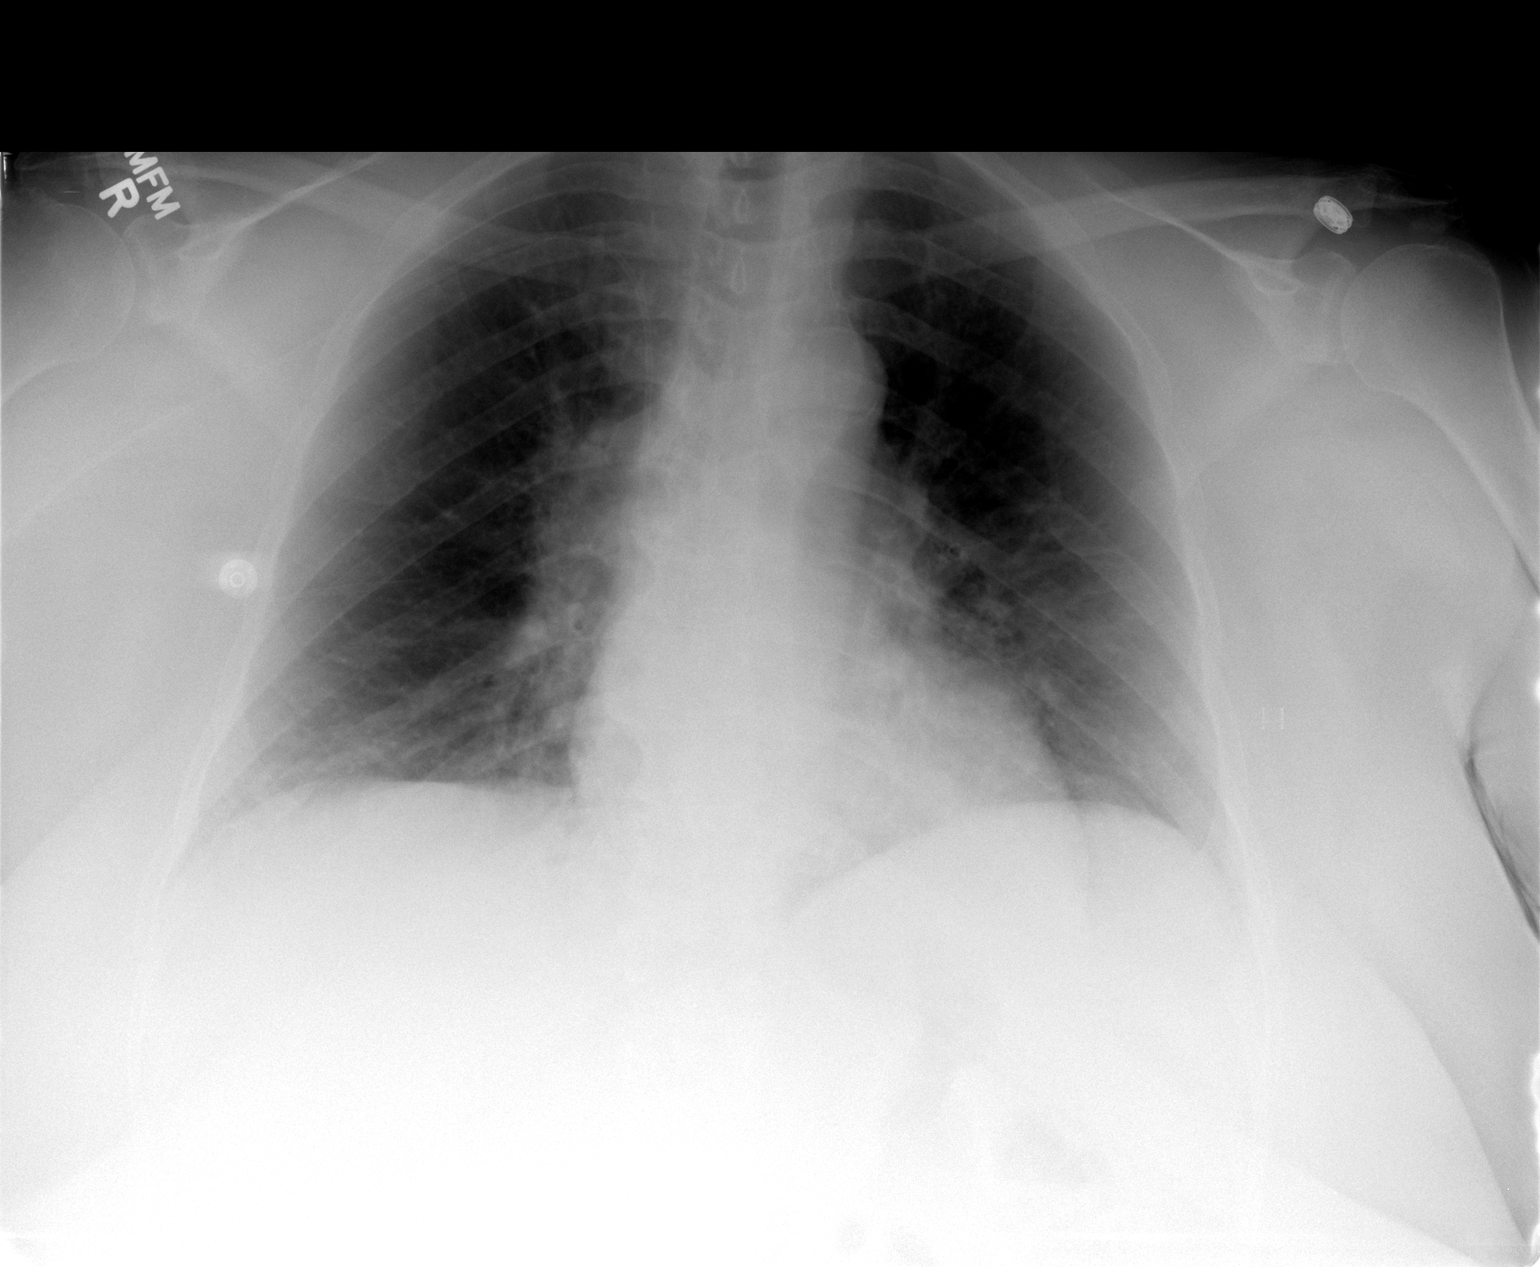

[2 of 2 positions shown; findings below may reference images not displayed]

FINDINGS: Cardiomediastinal silhouette is stable.  No acute
infiltrate or edema. Mild basilar atelectasis.  Bony thorax is
stable. Degenerative changes thoracic spine again noted.
IMPRESSION: No acute infiltrate or edema.  Mild basilar atelectasis.
Degenerative changes thoracic spine.

## 2011-10-15 IMAGING — CR DG CHEST 2V
2 series · 2 of 2 positions shown · non-contrast
Comparison: 05/02/2010

CLINICAL DATA: Shortness of breath

CHEST - 2 VIEW

[w chest pa *]
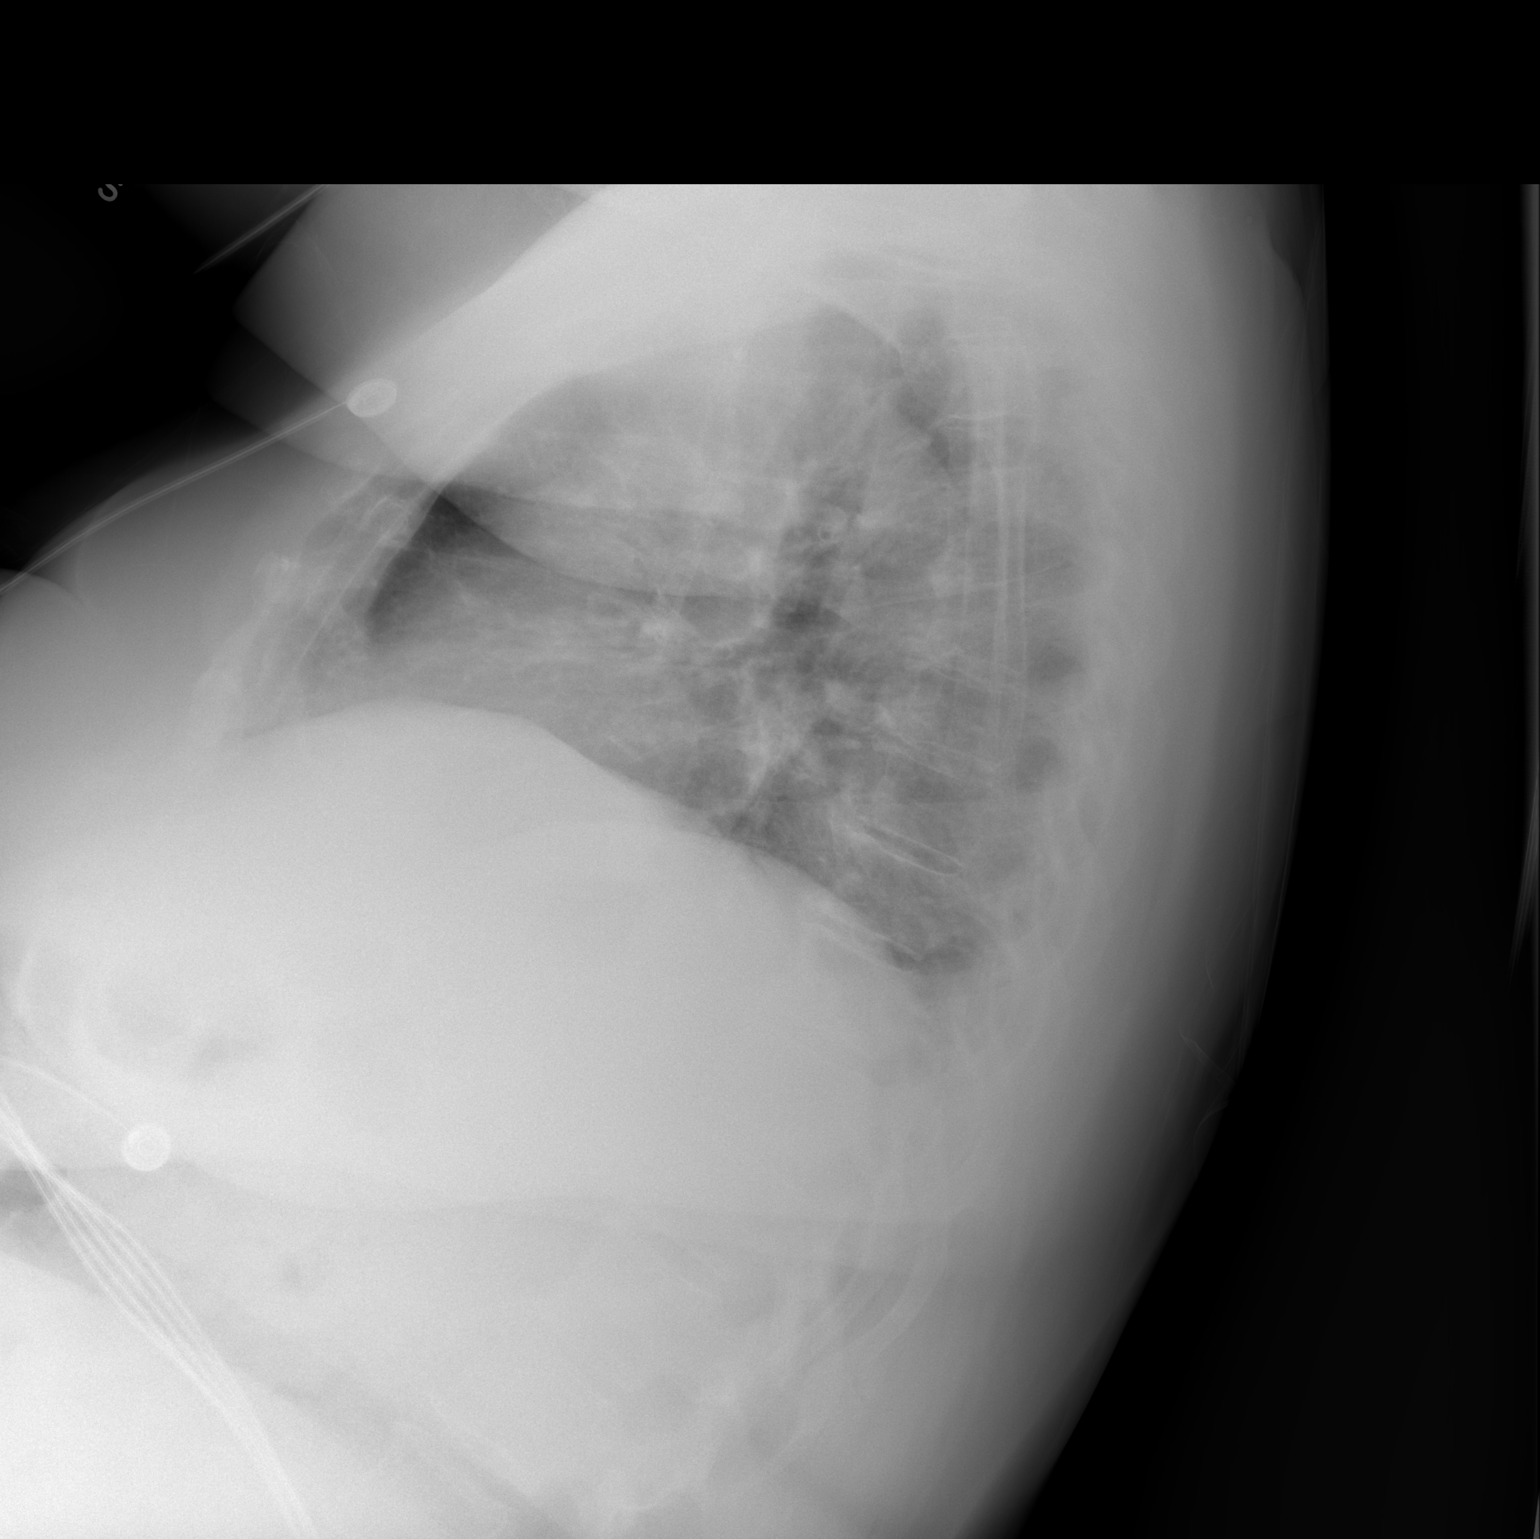

[view not recorded]
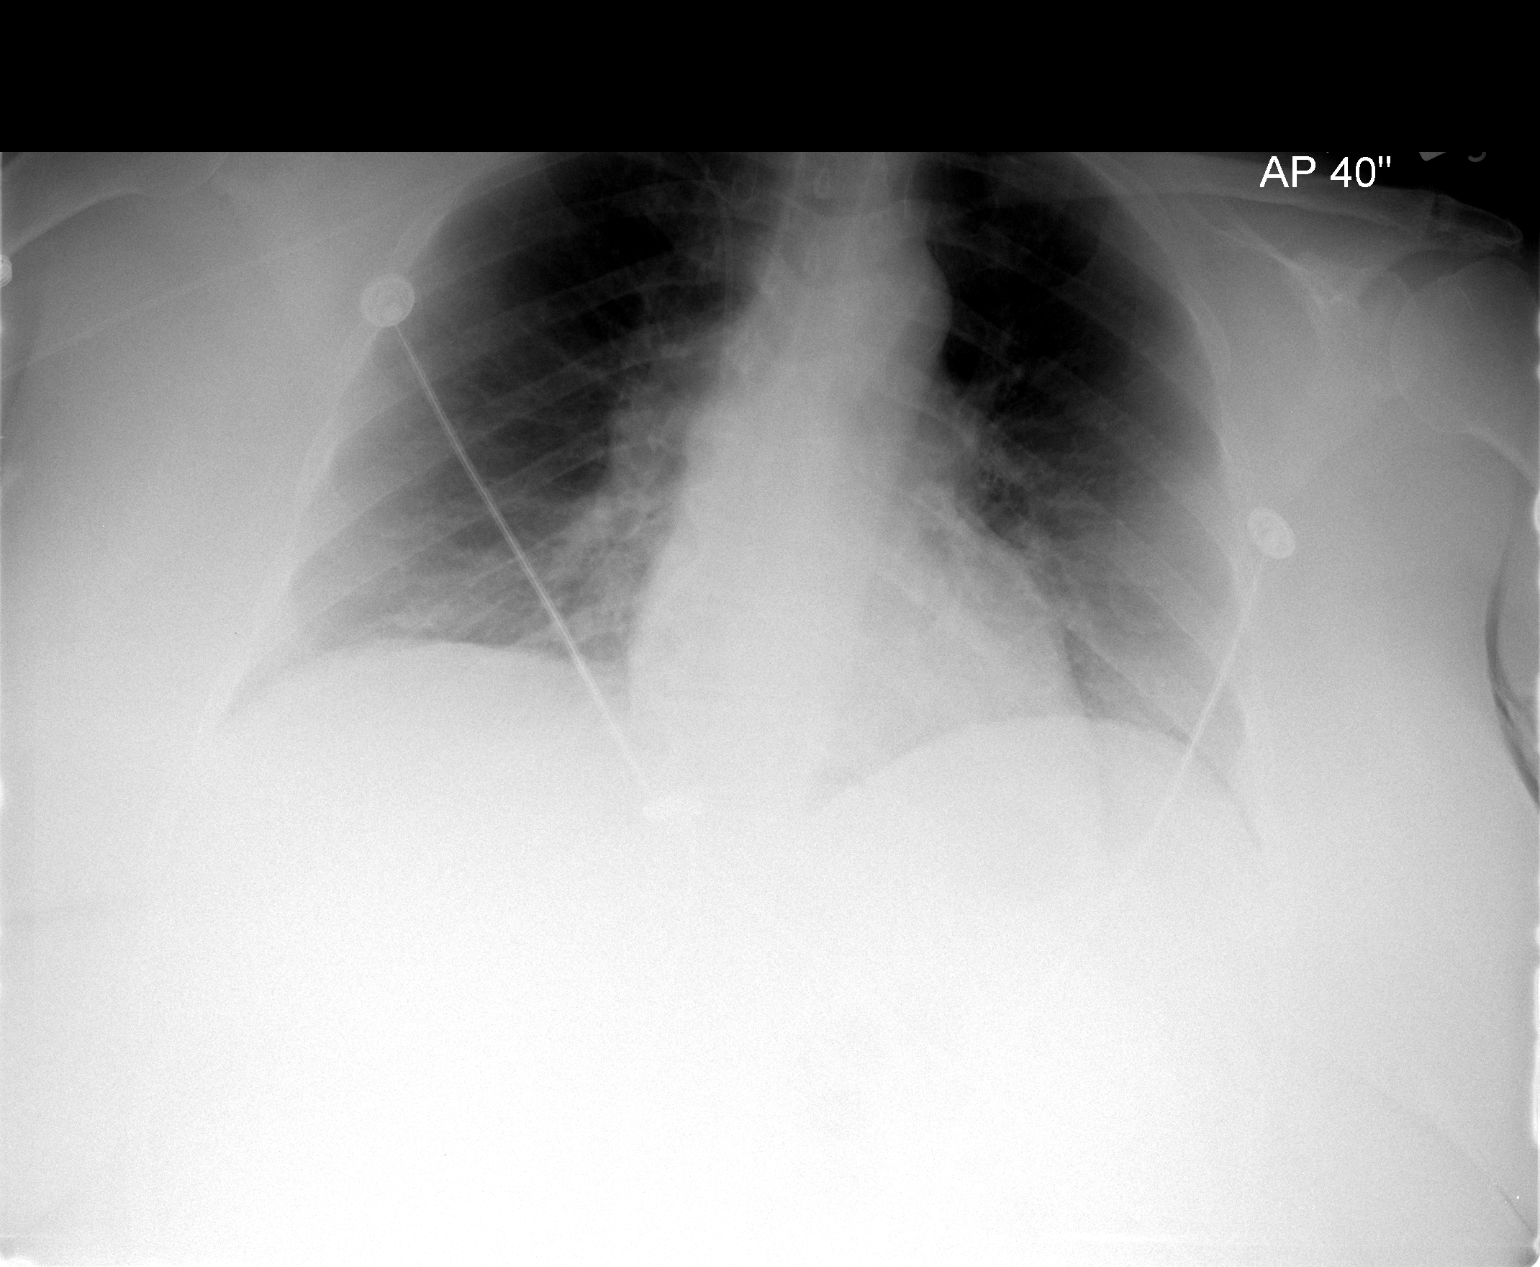

[2 of 2 positions shown; findings below may reference images not displayed]

FINDINGS: The lungs are clear without focal infiltrate, edema,
pneumothorax or pleural effusion.  There is some minimal basilar
atelectasis. The cardiopericardial silhouette is within normal
limits for size.  Right PICC line tip projects at the mid SVC
level. Telemetry leads overlie the chest.
IMPRESSION: Low volume film with basilar atelectasis.

## 2011-11-29 ENCOUNTER — Other Ambulatory Visit: Payer: Self-pay | Admitting: Nurse Practitioner

## 2012-03-25 ENCOUNTER — Ambulatory Visit (INDEPENDENT_AMBULATORY_CARE_PROVIDER_SITE_OTHER): Payer: Medicare Other | Admitting: Ophthalmology

## 2013-10-16 NOTE — Telephone Encounter (Signed)
Encounter was telephone call.
